# Patient Record
Sex: Female | Born: 1975
Health system: Southern US, Community
[De-identification: ages and names within clinical notes are randomized; demographics above are authoritative.]

## PROBLEM LIST (undated history)

## (undated) DIAGNOSIS — R310 Gross hematuria: Secondary | ICD-10-CM

---

## 2002-08-11 ENCOUNTER — Other Ambulatory Visit: Admission: RE | Admit: 2002-08-11 | Discharge: 2002-08-11 | Payer: Self-pay | Admitting: Obstetrics and Gynecology

## 2002-08-20 ENCOUNTER — Encounter: Admission: RE | Admit: 2002-08-20 | Discharge: 2002-08-20 | Payer: Self-pay | Admitting: Internal Medicine

## 2002-08-20 ENCOUNTER — Encounter: Payer: Self-pay | Admitting: Internal Medicine

## 2004-10-31 ENCOUNTER — Inpatient Hospital Stay (HOSPITAL_COMMUNITY): Admission: AD | Admit: 2004-10-31 | Discharge: 2004-11-02 | Payer: Self-pay | Admitting: *Deleted

## 2008-07-26 ENCOUNTER — Encounter: Admission: RE | Admit: 2008-07-26 | Discharge: 2008-07-26 | Payer: Self-pay | Admitting: Obstetrics

## 2008-09-26 ENCOUNTER — Inpatient Hospital Stay (HOSPITAL_COMMUNITY): Admission: AD | Admit: 2008-09-26 | Discharge: 2008-09-28 | Payer: Self-pay | Admitting: Obstetrics

## 2010-01-12 ENCOUNTER — Emergency Department (HOSPITAL_BASED_OUTPATIENT_CLINIC_OR_DEPARTMENT_OTHER): Admission: EM | Admit: 2010-01-12 | Discharge: 2010-01-12 | Payer: Self-pay | Admitting: Emergency Medicine

## 2010-01-12 ENCOUNTER — Ambulatory Visit: Payer: Self-pay | Admitting: Diagnostic Radiology

## 2010-01-19 ENCOUNTER — Ambulatory Visit (HOSPITAL_BASED_OUTPATIENT_CLINIC_OR_DEPARTMENT_OTHER): Admission: RE | Admit: 2010-01-19 | Discharge: 2010-01-19 | Payer: Self-pay | Admitting: Orthopedic Surgery

## 2010-01-19 HISTORY — PX: OTHER SURGICAL HISTORY: SHX169

## 2010-05-22 LAB — POCT HEMOGLOBIN-HEMACUE: Hemoglobin: 14.5 g/dL (ref 12.0–15.0)

## 2010-06-17 LAB — CBC
HCT: 35.6 % — ABNORMAL LOW (ref 36.0–46.0)
HCT: 36.1 % (ref 36.0–46.0)
Hemoglobin: 12.1 g/dL (ref 12.0–15.0)
Hemoglobin: 12.3 g/dL (ref 12.0–15.0)
MCHC: 33.5 g/dL (ref 30.0–36.0)
MCHC: 34.5 g/dL (ref 30.0–36.0)
MCV: 91 fL (ref 78.0–100.0)
MCV: 92.4 fL (ref 78.0–100.0)
Platelets: 132 10*3/uL — ABNORMAL LOW (ref 150–400)
Platelets: 133 10*3/uL — ABNORMAL LOW (ref 150–400)
RBC: 3.9 MIL/uL (ref 3.87–5.11)
RBC: 3.92 MIL/uL (ref 3.87–5.11)
RDW: 12.6 % (ref 11.5–15.5)
RDW: 12.9 % (ref 11.5–15.5)
WBC: 12.8 10*3/uL — ABNORMAL HIGH (ref 4.0–10.5)
WBC: 7.8 10*3/uL (ref 4.0–10.5)

## 2010-06-17 LAB — GLUCOSE, CAPILLARY
Glucose-Capillary: 78 mg/dL (ref 70–99)
Glucose-Capillary: 82 mg/dL (ref 70–99)
Glucose-Capillary: 84 mg/dL (ref 70–99)

## 2010-06-17 LAB — RPR: RPR Ser Ql: NONREACTIVE

## 2010-07-27 NOTE — H&P (Signed)
NAME:  Margaret Baker, Margaret Baker NO.:  [id number]   MEDICAL RECORD NO.:  [id number]          PATIENT TYPE:  INP   LOCATION:  9174                          FACILITY:  [facility]   PHYSICIAN:  Gerri Spore B. Earlene Plater, M.D.  DATE OF BIRTH:  05-May-1975   DATE OF ADMISSION:  10/31/2004  DATE OF DISCHARGE:                                HISTORY & PHYSICAL   ADMISSION DIAGNOSES:  1.  A 40 week intrauterine pregnancy.  2.  Labor.   HISTORY OF PRESENT ILLNESS:  A 35 year old white female, gravida 1, para 0,  seen earlier in the office today, found to have cervix 4 cm dilated, 80%  effaced, -1 station.  At her request, membranes were stripped.  She returned  later in the day, having contractions every 2-3 minutes and found to be 5 cm  dilated, completely effaced, and 0 station.  She is therefore admitted in  labor.  In addition, her blood pressure had been mildly elevated today in  the 140s/90s range without PIH symptoms.  Plan had been for collection of a  24 hour urine prior to the onset of labor.  This, of course, was not  completed as it had only been discussed today.  Prenatal care at [facility], Dr. Earlene Plater, otherwise uncomplicated.   PAST MEDICAL HISTORY:  Recurrent urinary tract infection without any  previous history of pyelonephritis.   PAST SURGICAL HISTORY:  None.   FAMILY HISTORY:  Noncontributory.   MEDICATIONS:  Prenatal vitamins.   ALLERGIES:  None.   SOCIAL HISTORY:  No alcohol, tobacco, or other drugs.   PRENATAL LABS:  O positive.  Rubella immune.  Hepatitis B HIV, RPR, GC,  Chlamydia all negative.  Group B strep negative.  Glucola normal.   PHYSICAL EXAMINATION:  VITAL SIGNS:  Blood pressure 140/90, weight 185,  fetal heart tones in the 150s.  GENERAL:  Alert and oriented, no acute distress.  SKIN:  Warm, dry, no lesions.  HEART:  Regular rate and rhythm.  LUNGS:  Clear to auscultation.  ABDOMEN:  Gravid.  Fundal height is appropriate.  Estimated fetal weight  8  pounds 1 ounce on ultrasound today.  PELVIC:  Cervix is 5 cm dilated, completely effaced, 0 station and vertex.   ASSESSMENT:  Term intrauterine pregnancy with labor.   PLAN:  Admission for management of labor, epidural p.r.n.  Check PIH labs.      Gerri Spore B. Earlene Plater, M.D.  Electronically Signed     WBD/MEDQ  D:  10/31/2004  T:  10/31/2004  Job:  [id number]

## 2010-07-27 NOTE — H&P (Signed)
NAME:  Margaret Baker, Margaret Baker NO.:  [id number]   MEDICAL RECORD NO.:  [id number]          PATIENT TYPE:  INP   LOCATION:  9174                          FACILITY:  [facility]   PHYSICIAN:  Richardean Sale, M.D.   DATE OF BIRTH:  19-Dec-1975   DATE OF ADMISSION:  10/31/2004  DATE OF DISCHARGE:                                HISTORY & PHYSICAL   ADMITTING DIAGNOSIS:  A 40 plus week intrauterine pregnancy, in labor.   HISTORY OF PRESENT ILLNESS:  This is a 35 year old, gravida 1, para 0, white  female with a due date of October 30, 2004 who presented to the office on  October 31, 2004 complaining of contractions.  The patient denies any vaginal  bleeding or leaking of fluid.  Reports good movement.  She is found to be 5  cm dilated in the office.  Blood pressure was mildly elevated in the office  at 140/90.  The patient denies any headaches, visual changes, or epigastric  pain.  Prenatal care has been at [facility] with Dr. Marina Gravel as  the primary attending, and pregnancy has been uncomplicated.   PAST MEDICAL HISTORY:  No prior hospitalizations or surgeries.   GYNECOLOGIC HISTORY:  No history of abnormal Pap smears or sexually  transmitted infections.   FAMILY HISTORY:  Positive for hypertension.  Heart failure in grandparents.  Breast cancer in grandmother.  Mother did have a mini-stroke.  No known  birth defects, congenital anomalies, or babies born with spina bifida, Down  syndrome, or cystic fibrosis.   SOCIAL HISTORY:  She denies tobacco, alcohol, or drugs.   MEDICATIONS:  Prenatal vitamins.   ALLERGIES:  No known drug allergies.   PHYSICAL EXAMINATION:  VITAL SIGNS:  Blood pressure is 140/95, heart rate in  the 90s and regular.  She is afebrile.  Fetal heart rate tracing shows 130-  140.  No decelerations noted.  Tocometer tracings - contractions irregular.  GENERAL:  She is a well-developed, well-nourished, white female who is in no  acute distress.  HEART:   Regular rate and rhythm.  LUNGS:  Clear to auscultation bilaterally.  ABDOMEN:  Gravid, soft, nontender.  No right upper quadrant pain elicited.  EXTREMITIES:  Trace edema in the feet bilaterally.  Deep tendon reflexes are  1 to 2+ bilaterally with no clonus.  There is no periorbital edema noted.  NEUROLOGIC:  Nonfocal.  PELVIC:  Cervix is 5 cm, 100%, -1 station, vertex.  Bag of water is intact.   PRENATAL LABORATORIES:  Blood type O positive.  Antibody screen negative.  RPR nonreactive.  Rubella immune.  Hepatitis B surface antigen non-reactive.  HIV non-reactive.  One-hour Glucola within normal limits.  Triple screen  within normal limits.  Pap test benign reactive changes.  Gonorrhea and  Chlamydia negative.  Group B beta strep negative.   ASSESSMENT:  Forty plus week intrauterine pregnancy in labor.   PLAN:  1.  Will admit to labor and delivery.  2.  Mild increase in blood pressure.  Monitor blood pressures closely.  No      signs of preeclampsia at present.  Will  check preeclampsia labs on      admission.  3.  Continuous fetal monitoring.  4.  The patient may have epidural if desired.  5.  Will augment with Pitocin or amniotomy if needed.  6.  Anticipate attempts at vaginal delivery.      Richardean Sale, M.D.  Electronically Signed     JW/MEDQ  D:  10/31/2004  T:  10/31/2004  Job:  [id number]

## 2011-03-13 ENCOUNTER — Other Ambulatory Visit (HOSPITAL_COMMUNITY)
Admission: RE | Admit: 2011-03-13 | Discharge: 2011-03-13 | Disposition: A | Discharge status: Home / Self Care | Payer: PRIVATE HEALTH INSURANCE | Source: Ambulatory Visit | Attending: Internal Medicine | Admitting: Internal Medicine

## 2011-03-13 ENCOUNTER — Encounter: Payer: Self-pay | Admitting: Internal Medicine

## 2011-03-13 ENCOUNTER — Ambulatory Visit (INDEPENDENT_AMBULATORY_CARE_PROVIDER_SITE_OTHER): Payer: PRIVATE HEALTH INSURANCE | Admitting: Internal Medicine

## 2011-03-13 DIAGNOSIS — R310 Gross hematuria: Secondary | ICD-10-CM

## 2011-03-13 DIAGNOSIS — R319 Hematuria, unspecified: Secondary | ICD-10-CM | POA: Insufficient documentation

## 2011-03-13 LAB — POCT URINALYSIS DIPSTICK
Bilirubin, UA: NEGATIVE
Glucose, UA: NEGATIVE
Ketones, UA: NEGATIVE
Nitrite, UA: NEGATIVE
Spec Grav, UA: >=1.03
Urobilinogen, UA: NEGATIVE
pH, UA: 6

## 2011-03-13 LAB — POCT RAPID STREP A (OFFICE): Rapid Strep A Screen: NEGATIVE

## 2011-03-14 LAB — COMPREHENSIVE METABOLIC PANEL
ALT: 21 U/L (ref 0–35)
AST: 20 U/L (ref 0–37)
Albumin: 4.7 g/dL (ref 3.5–5.2)
Alkaline Phosphatase: 59 U/L (ref 39–117)
BUN: 13 mg/dL (ref 6–23)
CO2: 25 mEq/L (ref 19–32)
Calcium: 9.3 mg/dL (ref 8.4–10.5)
Chloride: 103 mEq/L (ref 96–112)
Creat: 0.62 mg/dL (ref 0.50–1.10)
Glucose, Bld: 89 mg/dL (ref 70–99)
Potassium: 4.3 mEq/L (ref 3.5–5.3)
Sodium: 138 mEq/L (ref 135–145)
Total Bilirubin: 0.9 mg/dL (ref 0.3–1.2)
Total Protein: 6.4 g/dL (ref 6.0–8.3)

## 2011-03-14 LAB — CBC WITH DIFFERENTIAL/PLATELET
Basophils Absolute: 0 10*3/uL (ref 0.0–0.1)
Basophils Relative: 1 % (ref 0–1)
Eosinophils Absolute: 0.2 10*3/uL (ref 0.0–0.7)
Eosinophils Relative: 3 % (ref 0–5)
HCT: 41.1 % (ref 36.0–46.0)
Hemoglobin: 13.8 g/dL (ref 12.0–15.0)
Lymphocytes Relative: 40 % (ref 12–46)
Lymphs Abs: 2.4 10*3/uL (ref 0.7–4.0)
MCH: 29.6 pg (ref 26.0–34.0)
MCHC: 33.6 g/dL (ref 30.0–36.0)
MCV: 88.2 fL (ref 78.0–100.0)
Monocytes Absolute: 0.4 10*3/uL (ref 0.1–1.0)
Monocytes Relative: 7 % (ref 3–12)
Neutro Abs: 3.1 10*3/uL (ref 1.7–7.7)
Neutrophils Relative %: 50 % (ref 43–77)
Platelets: 235 10*3/uL (ref 150–400)
RBC: 4.66 MIL/uL (ref 3.87–5.11)
RDW: 12.5 % (ref 11.5–15.5)
WBC: 6.2 10*3/uL (ref 4.0–10.5)

## 2011-03-14 LAB — URINALYSIS
Glucose, UA: NEGATIVE mg/dL
Nitrite: NEGATIVE
Protein, ur: 100 mg/dL — AB
Specific Gravity, Urine: 1.023 (ref 1.005–1.030)
Urobilinogen, UA: 0.2 mg/dL (ref 0.0–1.0)
pH: 5.5 (ref 5.0–8.0)

## 2011-03-14 LAB — APTT: aPTT: 33 seconds (ref 24–37)

## 2011-03-14 LAB — ANTISTREPTOLYSIN O TITER: ASO: 152 IU/mL (ref 0–408)

## 2011-03-15 ENCOUNTER — Other Ambulatory Visit: Payer: Self-pay | Admitting: Internal Medicine

## 2011-03-15 ENCOUNTER — Ambulatory Visit
Admission: RE | Admit: 2011-03-15 | Discharge: 2011-03-15 | Disposition: A | Discharge status: Home / Self Care | Payer: PRIVATE HEALTH INSURANCE | Source: Ambulatory Visit | Attending: Internal Medicine | Admitting: Internal Medicine

## 2011-03-15 MED ORDER — IOHEXOL 300 MG/ML  SOLN
100.0000 mL | Freq: Once | INTRAMUSCULAR | Status: AC | PRN
  Administered 2011-03-15: 100 mL via INTRAVENOUS

## 2011-03-17 LAB — URINE CULTURE

## 2011-03-18 ENCOUNTER — Telehealth: Payer: Self-pay | Admitting: Internal Medicine

## 2011-03-18 NOTE — Telephone Encounter (Signed)
Prefer not to do either until urologist sees. I am calling them now.

## 2011-03-18 NOTE — Progress Notes (Signed)
  Subjective:    Patient ID: Margaret Baker, female    DOB: [birth year], 36 y.o.   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 She went to an urgent care and was treated with Cipro. This did not seem to help at all. Continues to have gross hematuria. Denies recent sore throat or viral infection. No dysuria. No fever or shaking chills. No nausea or vomiting. No history of gross hematuria in the past. General health has been good. Takes no chronic medications. Nonsmoker.    Review of Systems     Objective:   Physical Exam HEENT exam pharynx is clear TMs are clear; neck supple; no adenopathy chest clear; abdomen is benign; no CVA tenderness.        Assessment & Plan:  Gross hematuria without dysuria no response to Cipro   plan: Obtain lab work including CBC with differential, PT PTT, ASO titer C. met, urine culture, urine cytology. If these results are negative, dedicated CT of the kidney to rule out tumor. If CT of the kidney is negative, refer to urologist.

## 2011-03-18 NOTE — Patient Instructions (Signed)
Please await results of lab work. We will contact you with further instructions.

## 2011-03-18 NOTE — Telephone Encounter (Signed)
Spoke with patient this afternoon. Still has gross hematuria. Some mild back pain and headache. May take Tylenol. Avoid Advil. Urine culture 20,000 colonies per milliliter enterococcus. Have not retreated her. She is to see Dr. Vernie Ammons January 8 at 3 PM.

## 2011-03-27 ENCOUNTER — Other Ambulatory Visit: Payer: Self-pay | Admitting: Urology

## 2011-04-02 ENCOUNTER — Encounter (HOSPITAL_BASED_OUTPATIENT_CLINIC_OR_DEPARTMENT_OTHER): Payer: Self-pay | Admitting: *Deleted

## 2011-04-02 NOTE — Progress Notes (Signed)
NPO AFTER MN. NEEDS HH AND URINE PREG.

## 2011-04-07 NOTE — H&P (Signed)
History of Present Illness        Gross hematuria: This occurred in 6/04 and she was seen by Dr. Earlene Plater. A CT scan at that time was completely negative and specifically there are no renal lesions or calculi noted. Cystoscopically no abnormality was noted within the bladder. He recommended cytologies however it appears the patient did not undergo those.  Interval history: She reported that she had been having gross hematuria for 2 weeks when I saw her last. It occured each day and ws noted more in the morning. She describes the urine as brown/tea colored but had not seen any clots. This is not associated with any disuria, frequency or urgency. She has never had a kidney stone and noted no flank pain. She says that she has had some slight low back pain but no localizing flank pain. When this occurred in 2004 she said it only lasted for about 2 days.    I reviewed her CT scan images and noted that she in fact had a CT scan of the abdomen and pelvis with and without contrast which revealed no abnormality of the kidneys. I could not discern any stones or lesions within the collecting system of either kidney to suggest the source of her hematuria. She has had an extensive evaluation already with a negative CBC, CMP, PT, PTT, ASO titer, urine cytology and CT of the kidneys  Today she reports she continues to have gross hematuria. It is mild. It is still not associated with any flank pain or voiding symptoms.   Past Medical History Problems  1. History of  No Medical Problems  Surgical History Problems  1. History of  Leg Repair Right  Current Meds 1. No Reported Medications  Allergies Medication  1. No Known Drug Allergies  Family History Problems  1. Family history of  Breast Cancer V16.3 2. Family history of  Family Health Status - Mother's Age 53. Family history of  Family Health Status Children ___ Living Daughters 4. Family history of  Family Health Status Children ___ Living Sons 5. Family  history of  Prostate Cancer V16.42 6. Family history of  Stroke Syndrome V17.1  Social History Problems  1. Alcohol Use occassionally 2. Caffeine Use 0-1 daily 3. Marital History - Currently Married 4. Never A Smoker 5. Occupation: Research scientist (medical) Review of Systems Genitourinary, constitutional, skin, eye, otolaryngeal, hematologic/lymphatic, cardiovascular, pulmonary, endocrine, musculoskeletal, gastrointestinal, neurological and psychiatric system(s) were reviewed and pertinent findings if present are noted.  Genitourinary: hematuria.  Constitutional: feeling tired (fatigue).  ENT: sinus problems.  Musculoskeletal: back pain.  Neurological: headache.    Vitals Vital Signs BMI Calculated: 25.68 BSA Calculated: 1.79 Height: 5 ft 5.5 in Weight: 156 lb  Blood Pressure: 146 / 99 Heart Rate: 87  Physical Exam Constitutional: Well nourished and well developed . No acute distress.  ENT:. The ears and nose are normal in appearance.  Neck: The appearance of the neck is normal and no neck mass is present.  Pulmonary: No respiratory distress and normal respiratory rhythm and effort.  Cardiovascular: Heart rate and rhythm are normal . No peripheral edema.  Abdomen: The abdomen is soft and nontender. No masses are palpated. No CVA tenderness. No hernias are palpable. No hepatosplenomegaly noted.  Lymphatics: The femoral and inguinal nodes are not enlarged or tender.  Skin: Normal skin turgor, no visible rash and no visible skin lesions.  Neuro/Psych:. Mood and affect are appropriate.   Assessment  I found absolutely no abnormality of the bladder  cystoscopically. Her left ureteral orifice appeared a little bit different than the right ureteral orifice but I watched both of them he flux and was unable to determine any cloudy-appearing urine coming from either UO. I was pretty sure that I could see clear urine coming from her right UO. My suspicion lays on the left-hand side. I therefore have  discussed the fact that I think the bleeding is likely from a benign source but this needs to be completely determined and then also treated. I've gone over the procedure with her in detail including its risks and complications as well as the possible need for stent placement after the procedure. We also discussed, as one of the greatest risks, my not being able to find the source of the bleeding at the time of the procedure. Understanding this she has elected to proceed with further evaluation.   Plan Gross Hematuria (599.71)  1. Cysto  Done: 16Jan2013 2. Follow-up Schedule Surgery Office  Follow-up  Requested for: 16Jan2013 Health Maintenance (V70.0)  3. UA With REFLEX  Done: 16Jan2013 10:53AM   She will be scheduled for cystoscopy, bilateral retrograde pyelograms, ureteroscopy, possible biopsy, possible fulguration and possible stent placement under anesthesia as an outpatient.

## 2011-04-08 ENCOUNTER — Encounter (HOSPITAL_BASED_OUTPATIENT_CLINIC_OR_DEPARTMENT_OTHER)
Admission: RE | Disposition: A | Discharge status: Home / Self Care | Payer: Self-pay | Source: Ambulatory Visit | Attending: Urology

## 2011-04-08 ENCOUNTER — Encounter (HOSPITAL_BASED_OUTPATIENT_CLINIC_OR_DEPARTMENT_OTHER): Payer: Self-pay | Admitting: Anesthesiology

## 2011-04-08 ENCOUNTER — Encounter (HOSPITAL_BASED_OUTPATIENT_CLINIC_OR_DEPARTMENT_OTHER): Payer: Self-pay | Admitting: *Deleted

## 2011-04-08 ENCOUNTER — Ambulatory Visit (HOSPITAL_BASED_OUTPATIENT_CLINIC_OR_DEPARTMENT_OTHER): Payer: PRIVATE HEALTH INSURANCE | Admitting: Anesthesiology

## 2011-04-08 ENCOUNTER — Ambulatory Visit (HOSPITAL_BASED_OUTPATIENT_CLINIC_OR_DEPARTMENT_OTHER)
Admission: RE | Admit: 2011-04-08 | Discharge: 2011-04-08 | Disposition: A | Discharge status: Home / Self Care | Payer: PRIVATE HEALTH INSURANCE | Source: Ambulatory Visit | Attending: Urology | Admitting: Urology

## 2011-04-08 DIAGNOSIS — R310 Gross hematuria: Secondary | ICD-10-CM

## 2011-04-08 DIAGNOSIS — M545 Low back pain, unspecified: Secondary | ICD-10-CM | POA: Insufficient documentation

## 2011-04-08 HISTORY — DX: Gross hematuria: R31.0

## 2011-04-08 HISTORY — PX: CYSTOSCOPY W/ RETROGRADES: SHX1426

## 2011-04-08 LAB — POCT I-STAT 4, (NA,K, GLUC, HGB,HCT)
Glucose, Bld: 108 mg/dL — ABNORMAL HIGH (ref 70–99)
HCT: 39 % (ref 36.0–46.0)
Hemoglobin: 13.3 g/dL (ref 12.0–15.0)
Potassium: 4.1 mEq/L (ref 3.5–5.1)
Sodium: 143 mEq/L (ref 135–145)

## 2011-04-08 LAB — POCT PREGNANCY, URINE: Preg Test, Ur: NEGATIVE

## 2011-04-08 SURGERY — CYSTOSCOPY, WITH RETROGRADE PYELOGRAM
Anesthesia: General | Site: Ureter | Laterality: Bilateral | Wound class: Clean Contaminated

## 2011-04-08 MED ORDER — PROPOFOL 10 MG/ML IV EMUL
INTRAVENOUS | Status: DC | PRN
  Administered 2011-04-08: 250 mg via INTRAVENOUS

## 2011-04-08 MED ORDER — ONDANSETRON HCL 4 MG/2ML IJ SOLN
INTRAMUSCULAR | Status: DC | PRN
  Administered 2011-04-08: 4 mg via INTRAVENOUS

## 2011-04-08 MED ORDER — HYDROCODONE-ACETAMINOPHEN 10-300 MG PO TABS: 1.0000 | ORAL_TABLET | Freq: Four times a day (QID) | ORAL | Status: DC | PRN

## 2011-04-08 MED ORDER — FENTANYL CITRATE 0.05 MG/ML IJ SOLN
25.0000 ug | INTRAMUSCULAR | Status: DC | PRN
  Administered 2011-04-08: 50 ug via INTRAVENOUS

## 2011-04-08 MED ORDER — CIPROFLOXACIN IN D5W 200 MG/100ML IV SOLN
200.0000 mg | INTRAVENOUS | Status: AC
  Administered 2011-04-08: 200 mg via INTRAVENOUS

## 2011-04-08 MED ORDER — BELLADONNA ALKALOIDS-OPIUM 16.2-60 MG RE SUPP
RECTAL | Status: DC | PRN
  Administered 2011-04-08: 1 via RECTAL

## 2011-04-08 MED ORDER — PHENAZOPYRIDINE HCL 200 MG PO TABS
200.0000 mg | ORAL_TABLET | Freq: Once | ORAL | Status: AC
  Administered 2011-04-08: 200 mg via ORAL

## 2011-04-08 MED ORDER — HYDROCODONE-ACETAMINOPHEN 5-325 MG PO TABS
1.0000 | ORAL_TABLET | Freq: Four times a day (QID) | ORAL | Status: DC | PRN
  Administered 2011-04-08: 1 via ORAL

## 2011-04-08 MED ORDER — IOHEXOL 350 MG/ML SOLN
INTRAVENOUS | Status: DC | PRN
  Administered 2011-04-08: 50 mL via INTRAVENOUS

## 2011-04-08 MED ORDER — LACTATED RINGERS IV SOLN
INTRAVENOUS | Status: DC
  Administered 2011-04-08: 10:00:00 via INTRAVENOUS

## 2011-04-08 MED ORDER — PHENAZOPYRIDINE HCL 200 MG PO TABS: 200.0000 mg | ORAL_TABLET | Freq: Three times a day (TID) | ORAL | Status: AC | PRN

## 2011-04-08 MED ORDER — LACTATED RINGERS IV SOLN
INTRAVENOUS | Status: DC
  Administered 2011-04-08: 100 mL/h via INTRAVENOUS
  Administered 2011-04-08: 08:00:00 via INTRAVENOUS

## 2011-04-08 MED ORDER — LIDOCAINE HCL (CARDIAC) 20 MG/ML IV SOLN
INTRAVENOUS | Status: DC | PRN
  Administered 2011-04-08: 100 mg via INTRAVENOUS

## 2011-04-08 MED ORDER — MIDAZOLAM HCL 5 MG/5ML IJ SOLN
INTRAMUSCULAR | Status: DC | PRN
  Administered 2011-04-08: 2 mg via INTRAVENOUS

## 2011-04-08 MED ORDER — DEXAMETHASONE SODIUM PHOSPHATE 4 MG/ML IJ SOLN
INTRAMUSCULAR | Status: DC | PRN
  Administered 2011-04-08: 10 mg via INTRAVENOUS

## 2011-04-08 MED ORDER — FENTANYL CITRATE 0.05 MG/ML IJ SOLN
INTRAMUSCULAR | Status: DC | PRN
  Administered 2011-04-08: 25 ug via INTRAVENOUS
  Administered 2011-04-08: 50 ug via INTRAVENOUS
  Administered 2011-04-08: 25 ug via INTRAVENOUS

## 2011-04-08 MED ORDER — PROMETHAZINE HCL 25 MG/ML IJ SOLN: 6.2500 mg | INTRAMUSCULAR | Status: DC | PRN

## 2011-04-08 MED ORDER — OXYBUTYNIN CHLORIDE 5 MG PO TABS: 5.0000 mg | ORAL_TABLET | Freq: Once | ORAL | Status: DC

## 2011-04-08 MED ORDER — SODIUM CHLORIDE 0.9 % IR SOLN
Status: DC | PRN
  Administered 2011-04-08: 6000 mL

## 2011-04-08 SURGICAL SUPPLY — 40 items
ADAPTER CATH URET PLST 4-6FR (CATHETERS) ×2 IMPLANT
ADPR CATH URET STRL DISP 4-6FR (CATHETERS) ×2
BAG DRAIN URO-CYSTO SKYTR STRL (DRAIN) ×3 IMPLANT
BAG DRN UROCATH (DRAIN) ×2
BASKET LASER NITINOL 1.9FR (BASKET) IMPLANT
BASKET STNLS GEMINI 4WIRE 3FR (BASKET) IMPLANT
BASKET ZERO TIP NITINOL 2.4FR (BASKET) IMPLANT
BRUSH URET BIOPSY 3F (UROLOGICAL SUPPLIES) IMPLANT
BSKT STON RTRVL 120 1.9FR (BASKET)
BSKT STON RTRVL GEM 120X11 3FR (BASKET)
BSKT STON RTRVL ZERO TP 2.4FR (BASKET)
CANISTER SUCT LVC 12 LTR MEDI- (MISCELLANEOUS) ×2 IMPLANT
CATH INTERMIT  6FR 70CM (CATHETERS) IMPLANT
CATH URET 5FR 28IN CONE TIP (BALLOONS)
CATH URET 5FR 70CM CONE TIP (BALLOONS) IMPLANT
CLOTH BEACON ORANGE TIMEOUT ST (SAFETY) ×3 IMPLANT
DRAPE CAMERA CLOSED 9X96 (DRAPES) ×3 IMPLANT
ELECT REM PT RETURN 9FT ADLT (ELECTROSURGICAL)
ELECTRODE REM PT RTRN 9FT ADLT (ELECTROSURGICAL) IMPLANT
GLOVE BIO SURGEON STRL SZ8 (GLOVE) ×3 IMPLANT
GLOVE INDICATOR 6.5 STRL GRN (GLOVE) ×6 IMPLANT
GOWN PREVENTION PLUS LG XLONG (DISPOSABLE) ×3 IMPLANT
GOWN STRL REIN XL XLG (GOWN DISPOSABLE) ×3 IMPLANT
GUIDEWIRE 0.038 PTFE COATED (WIRE) IMPLANT
GUIDEWIRE ANG ZIPWIRE 038X150 (WIRE) IMPLANT
GUIDEWIRE STR DUAL SENSOR (WIRE) ×3 IMPLANT
IV NS IRRIG 3000ML ARTHROMATIC (IV SOLUTION) ×6 IMPLANT
KIT BALLIN UROMAX 15FX10 (LABEL) IMPLANT
KIT BALLN UROMAX 15FX4 (MISCELLANEOUS) IMPLANT
KIT BALLN UROMAX 26 75X4 (MISCELLANEOUS)
LASER FIBER DISP (UROLOGICAL SUPPLIES) IMPLANT
PACK CYSTOSCOPY (CUSTOM PROCEDURE TRAY) ×3 IMPLANT
SET HIGH PRES BAL DIL (LABEL)
SHEATH ACCESS URETERAL 38CM (SHEATH) ×2 IMPLANT
SHEATH URET ACCESS 12FR/35CM (UROLOGICAL SUPPLIES) IMPLANT
SHEATH URET ACCESS 12FR/55CM (UROLOGICAL SUPPLIES) IMPLANT
STENT PERCUFLEX 4.8FRX24 (STENTS) ×4 IMPLANT
SYR 30ML LL (SYRINGE) ×2 IMPLANT
SYR CONTROL 10ML LL (SYRINGE) ×2 IMPLANT
WATER STERILE IRR 3000ML UROMA (IV SOLUTION) IMPLANT

## 2011-04-08 NOTE — Anesthesia Preprocedure Evaluation (Addendum)
Anesthesia Evaluation  Patient identified by MRN, date of birth, ID band Patient awake    Reviewed: Allergy & Precautions, H&P , NPO status , Patient's Chart, lab work & pertinent test results  History of Anesthesia Complications Negative for: history of anesthetic complications  Airway Mallampati: I TM Distance: >3 FB Neck ROM: full    Dental No notable dental hx. (+) Teeth Intact and Dental Advidsory Given   Pulmonary neg pulmonary ROS,  clear to auscultation  Pulmonary exam normal       Cardiovascular Exercise Tolerance: Good neg cardio ROS regular Normal    Neuro/Psych Negative Neurological ROS  Negative Psych ROS   GI/Hepatic negative GI ROS, Neg liver ROS,   Endo/Other  Negative Endocrine ROS  Renal/GU negative Renal ROS  Genitourinary negative   Musculoskeletal   Abdominal Normal abdominal exam  (+)   Peds  Hematology negative hematology ROS (+)   Anesthesia Other Findings   Reproductive/Obstetrics negative OB ROS                          Anesthesia Physical Anesthesia Plan  ASA: I  Anesthesia Plan: General   Post-op Pain Management:    Induction:   Airway Management Planned:   Additional Equipment:   Intra-op Plan:   Post-operative Plan:   Informed Consent: I have reviewed the patients History and Physical, chart, labs and discussed the procedure including the risks, benefits and alternatives for the proposed anesthesia with the patient or authorized representative who has indicated his/her understanding and acceptance.   Dental Advisory Given  Plan Discussed with: CRNA  Anesthesia Plan Comments:         Anesthesia Quick Evaluation

## 2011-04-08 NOTE — Op Note (Signed)
PATIENT:  Margaret Baker  PRE-OPERATIVE DIAGNOSIS: Gross, painless hematuria   POST-OPERATIVE DIAGNOSIS: Same  PROCEDURE:  1. Cystoscopy 2. Bilateral retrograde pyelograms with interpretation. 3. Bilateral ureteroscopy. 4. Bilateral double-J stent placement.  SURGEON: Garnett Farm, MD  INDICATION: Margaret Baker is a 36 year old female patient who has been experiencing gross, painless hematuria for nearly a month. She has been evaluated thoroughly with multiple laboratory studies all of which were found to be negative. She has also had a urine cytology that was negative and a CT scan without contrast that revealed no renal lesions or calculi. She underwent cystoscopy in my office with no intravesical lesion having been noted and possible slightly discolored reflux from the left UO. She is therefore brought to the operating room today for full valuation upper upper tracts with retrograde pyelography and probable ureteroscopy.  ANESTHESIA:  General  EBL:  Minimal  DRAINS: 4 French, 24 cm contour stents on right and left sides. (String present)  SPECIMEN:  None   DESCRIPTION OF PROCEDURE: The patient was taken to the major OR and placed on the table. General anesthesia was administered and then the patient was moved to the dorsal lithotomy position. Her genitalia was sterilely prepped and draped. An official timeout was performed.  Initially the 22 French cystoscope with 12 lens was passed under direct vision into the bladder. The bladder was then entered and fully inspected. It was noted be free of any tumors stones or inflammatory lesions. Ureteral orifices were of normal configuration and position. I observed the ureteral orifices were effluxing noted what appeared to be clear reflux from the right. On the left-hand side there appeared to be clear reflux as well however I noted whitish material that floated in the urine and was particulate in nature effluxing with the urine. A 6 French  open-ended ureteral catheter was then passed through the cystoscope into the ureteral orifice in order to perform a right retrograde pyelogram initially.  A retrograde pyelogram was performed by injecting full-strength contrast up the right ureter under direct fluoroscopic control. This revealed a normal ureter throughout its length with no mass effect or filling defects. The patient had a large renal pelvis but no evidence of UPJ obstruction. The calyces all appeared sharp. There did however appear to be an oval filling defect in the upper pole. I then proceeded to perform a left retrograde pyelogram in an identical fashion. This study revealed a normal ureter as well throughout its entire length. There also appeared to be oval filling defects but these were definitely associated with the renal papilla. Otherwise there appeared to be absolutely no abnormality.  I redirected my attention to the right ureteral orifice and passed a guidewire through the cystoscope and up the ureter and near the renal pelvis. I then used the inner portion of the ureteral access sheath to gently dilate the intramural ureter and ureter up into the renal pelvic region. I noted that passage of the access sheath inner cannula kidney with mild resistance but this was not considered pathologic in nature. After leaving the access sheath in place for 2 minutes I removed this and left the guidewire in place. The 7 [place] digital ureteroscope was then passed over the guidewire and up the right ureter under direct visualization. No abnormality of the entire ureter was noted. I then entered the renal collecting system and first drained the system through the ureteroscope using a syringe. I then visualized the area where the filling defect was seen on pyelography and  this appeared to be a renal papilla. I inspected every calyx within the right kidney and noted no abnormality other than the fact that all of the renal papilla appeared bulbar in  shape with superficial vessels noted on each of them. They all appeared identical in this configuration and no active bleeding was identified as well as no abnormal lesions within any of the calyces, infundibulum he or renal pelvis. I therefore removed the ureteroscope and redirected my attention to the left side.  I reinserted the cystoscope, passed the guidewire into the renal pelvis, dilated the left ureter in an identical fashion as the right side and then passed the ureteroscope over the guidewire again under direct visualization. This revealed a normal ureter throughout its length. Upon entering the renal pelvis I repeated my systematic inspection of each calyx and found again that each of them had a somewhat bulbar appearance distinctly different from the typical cone shape, striated renal papilla normally seen and I did find a middle pole calyx with a renal papilla that appeared entirely normal however all of the other papilla had the bulbar appearance similar to the contralateral kidney. I removed all fluid from the renal pelvis and filled it just slightly with fluid under no pressure to attempt to visualize any bleeding source and none could be identified. The renal pelvis was inspected and also noted be normal. I therefore passed a guidewire through the ureteroscope and left this in place and removed the ureteroscope.  The cystoscope was backloaded over the guidewire and a stent was passed over the guidewire into the area the renal pelvis on the left side. Good curl was noted in the renal pelvis and bladder as the guidewire was removed. This was performed on the right side after passing a guidewire up the right ureter and again good curl was noted in the renal pelvis and bladder. I therefore drained the bladder. The string affixed to the distal aspect of the stents were left on and will be used for stent removal in the office and were affixed to the patient with tape. She was awakened and taken to the  recovery room in stable and satisfactory condition. No intraoperative complications occurred.  PLAN OF CARE: Discharge to home after PACU  PATIENT DISPOSITION:  PACU - hemodynamically stable.

## 2011-04-08 NOTE — Transfer of Care (Signed)
Immediate Anesthesia Transfer of Care Note  Patient: Margaret Baker  Procedure(s) Performed:  CYSTOSCOPY WITH RETROGRADE PYELOGRAM; CYSTOSCOPY WITH STENT PLACEMENT  Patient Location: PACU  Anesthesia Type: General  Level of Consciousness: drowsy, follows commands, oriented.  Airway & Oxygen Therapy: Patient Spontanous Breathing and Patient connected to face mask oxygen  Post-op Assessment: Report given to PACU RN and Post -op Vital signs reviewed and stable  Post vital signs: Reviewed and stable  Complications: No apparent anesthesia complications

## 2011-04-08 NOTE — Interval H&P Note (Signed)
History and Physical Interval Note:  04/08/2011 6:58 AM  Margaret Baker  has presented today for surgery, with the diagnosis of GROSS HEMATURIA  The various methods of treatment have been discussed with the patient and family. After consideration of risks, benefits and other options for treatment, the patient has consented to  Procedure(s): CYSTOSCOPY WITH RETROGRADE PYELOGRAM, URETEROSCOPY AND STENT PLACEMENT as a surgical intervention .  The patients' history has been reviewed, patient examined, no change in status, stable for surgery.  I have reviewed the patients' chart and labs.  Questions were answered to the patient's satisfaction.     Garnett Farm

## 2011-04-08 NOTE — Anesthesia Procedure Notes (Signed)
Procedure Name: LMA Insertion Date/Time: 04/08/2011 7:35 AM Performed by: Huel Coventry Pre-anesthesia Checklist: Patient identified, Emergency Drugs available, Suction available and Patient being monitored Patient Re-evaluated:Patient Re-evaluated prior to inductionOxygen Delivery Method: Circle System Utilized Preoxygenation: Pre-oxygenation with 100% oxygen Intubation Type: IV induction Ventilation: Mask ventilation without difficulty LMA: LMA inserted LMA Size: 4.0 Number of attempts: 1 Airway Equipment and Method: bite block Placement Confirmation: positive ETCO2 Tube secured with: Tape Dental Injury: Teeth and Oropharynx as per pre-operative assessment

## 2011-04-08 NOTE — Anesthesia Postprocedure Evaluation (Signed)
Anesthesia Post Note  Patient: Margaret Baker  Procedure(s) Performed:  CYSTOSCOPY WITH RETROGRADE PYELOGRAM; CYSTOSCOPY WITH STENT PLACEMENT  Anesthesia type: General  Patient location: PACU  Post pain: Pain level controlled  Post assessment: Post-op Vital signs reviewed  Last Vitals:  Filed Vitals:   04/08/11 0915  BP:   Pulse: 71  Temp:   Resp: 14    Post vital signs: Reviewed  Level of consciousness: sedated  Complications: No apparent anesthesia complications

## 2011-04-09 ENCOUNTER — Encounter (HOSPITAL_BASED_OUTPATIENT_CLINIC_OR_DEPARTMENT_OTHER): Payer: Self-pay | Admitting: Urology

## 2015-11-24 DIAGNOSIS — Z23 Encounter for immunization: Secondary | ICD-10-CM | POA: Diagnosis not present

## 2015-11-24 DIAGNOSIS — Z6824 Body mass index (BMI) 24.0-24.9, adult: Secondary | ICD-10-CM | POA: Diagnosis not present

## 2015-11-24 DIAGNOSIS — Z30433 Encounter for removal and reinsertion of intrauterine contraceptive device: Secondary | ICD-10-CM | POA: Diagnosis not present

## 2015-11-24 DIAGNOSIS — Z3202 Encounter for pregnancy test, result negative: Secondary | ICD-10-CM | POA: Diagnosis not present

## 2015-11-24 DIAGNOSIS — Z1151 Encounter for screening for human papillomavirus (HPV): Secondary | ICD-10-CM | POA: Diagnosis not present

## 2015-11-24 DIAGNOSIS — Z01419 Encounter for gynecological examination (general) (routine) without abnormal findings: Secondary | ICD-10-CM | POA: Diagnosis not present

## 2015-12-19 DIAGNOSIS — Z1329 Encounter for screening for other suspected endocrine disorder: Secondary | ICD-10-CM | POA: Diagnosis not present

## 2015-12-19 DIAGNOSIS — Z30431 Encounter for routine checking of intrauterine contraceptive device: Secondary | ICD-10-CM | POA: Diagnosis not present

## 2015-12-19 DIAGNOSIS — Z13220 Encounter for screening for lipoid disorders: Secondary | ICD-10-CM | POA: Diagnosis not present

## 2015-12-19 DIAGNOSIS — Z130 Encounter for screening for diseases of the blood and blood-forming organs and certain disorders involving the immune mechanism: Secondary | ICD-10-CM | POA: Diagnosis not present

## 2015-12-19 DIAGNOSIS — Z131 Encounter for screening for diabetes mellitus: Secondary | ICD-10-CM | POA: Diagnosis not present

## 2015-12-19 DIAGNOSIS — Z0000 Encounter for general adult medical examination without abnormal findings: Secondary | ICD-10-CM | POA: Diagnosis not present

## 2016-10-24 ENCOUNTER — Ambulatory Visit (INDEPENDENT_AMBULATORY_CARE_PROVIDER_SITE_OTHER): Payer: Self-pay | Admitting: Internal Medicine

## 2016-10-24 ENCOUNTER — Encounter: Payer: Self-pay | Admitting: Internal Medicine

## 2016-10-24 VITALS — BP 136/90 | HR 83 | Temp 97.9°F | Wt 171.0 lb

## 2016-10-24 DIAGNOSIS — N390 Urinary tract infection, site not specified: Secondary | ICD-10-CM | POA: Diagnosis not present

## 2016-10-24 LAB — POCT URINALYSIS DIPSTICK
Bilirubin, UA: NEGATIVE
Blood, UA: NEGATIVE
Glucose, UA: NEGATIVE
Ketones, UA: NEGATIVE
Leukocytes, UA: NEGATIVE
Nitrite, UA: NEGATIVE
Protein, UA: NEGATIVE
Spec Grav, UA: <=1.005 — AB
Urobilinogen, UA: 0.2 U/dL
pH, UA: 6

## 2016-10-24 MED ORDER — FLUCONAZOLE 150 MG PO TABS: 150.0000 mg | ORAL_TABLET | Freq: Once | ORAL | 1 refills | Status: AC

## 2016-10-24 MED ORDER — CIPROFLOXACIN HCL 500 MG PO TABS: 500.0000 mg | ORAL_TABLET | Freq: Two times a day (BID) | ORAL | 0 refills | Status: DC

## 2016-10-24 NOTE — Progress Notes (Signed)
   Subjective:    Patient ID: Margaret Baker, female    DOB: Apr 28, 1975, 41 y.o.   MRN: [id number]  HPI  41 year old  Female  With UTI symptoms  Onset around August 7. It had intercourse for a couple of days prior to onset of symptoms. She had considerable dysuria Past a little clot of blood. No back pain. Did have some chills that evening but no fever. No vomiting.  History of gross hematuria 2013 seen by Dr. on line and had cystoscopy, retrograde pyelogram with stent placement. No bleeding was identified. Each renal pelvis had a somewhat bulbar appearance that was different from normal. Stents were removed in the office. She had no further issues.  She has been taking some Bactrim for the past several days with minimal relief. The graft urine dipstick today is negative.    Review of Systems see above     Objective:   Physical Exam No CVA tenderness         Assessment & Plan:  Dysuria? Partially treated UTI  Plan: Urine culture pending. She has tried Cipro before with success. Start Cipro 500 mg daily for 7 days. Diflucan provided for possible candida vaginitis while on antibiotics. She is to call if not better in one week.

## 2016-10-24 NOTE — Patient Instructions (Signed)
Cipro 500 mg twice daily for 7 days. Diflucan if needed for Candida vaginitis. Call if not better in 7 days. It was a pleasure to see you today.

## 2016-10-25 LAB — URINE CULTURE: Organism ID, Bacteria: NO GROWTH

## 2017-01-10 ENCOUNTER — Other Ambulatory Visit: Payer: BLUE CROSS/BLUE SHIELD | Admitting: Internal Medicine

## 2017-01-10 DIAGNOSIS — Z1321 Encounter for screening for nutritional disorder: Secondary | ICD-10-CM

## 2017-01-10 DIAGNOSIS — Z130 Encounter for screening for diseases of the blood and blood-forming organs and certain disorders involving the immune mechanism: Secondary | ICD-10-CM | POA: Diagnosis not present

## 2017-01-10 DIAGNOSIS — Z13220 Encounter for screening for lipoid disorders: Secondary | ICD-10-CM | POA: Diagnosis not present

## 2017-01-10 DIAGNOSIS — Z1329 Encounter for screening for other suspected endocrine disorder: Secondary | ICD-10-CM | POA: Diagnosis not present

## 2017-01-10 DIAGNOSIS — Z0000 Encounter for general adult medical examination without abnormal findings: Secondary | ICD-10-CM

## 2017-01-11 LAB — COMPLETE METABOLIC PANEL WITH GFR
AG Ratio: 1.8 (calc) (ref 1.0–2.5)
ALT: 21 U/L (ref 6–29)
AST: 18 U/L (ref 10–30)
Albumin: 4 g/dL (ref 3.6–5.1)
Alkaline phosphatase (APISO): 55 U/L (ref 33–115)
BUN: 14 mg/dL (ref 7–25)
CO2: 27 mmol/L (ref 20–32)
Calcium: 9.1 mg/dL (ref 8.6–10.2)
Chloride: 103 mmol/L (ref 98–110)
Creat: 0.64 mg/dL (ref 0.50–1.10)
GFR, Est African American: 129 mL/min/{1.73_m2} (ref >=60)
GFR, Est Non African American: 112 mL/min/{1.73_m2} (ref >=60)
Globulin: 2.2 g/dL (calc) (ref 1.9–3.7)
Glucose, Bld: 97 mg/dL (ref 65–99)
Potassium: 4.5 mmol/L (ref 3.5–5.3)
Sodium: 138 mmol/L (ref 135–146)
Total Bilirubin: 0.6 mg/dL (ref 0.2–1.2)
Total Protein: 6.2 g/dL (ref 6.1–8.1)

## 2017-01-11 LAB — LIPID PANEL
Cholesterol: 171 mg/dL (ref <200)
HDL: 59 mg/dL (ref >50)
LDL Cholesterol (Calc): 97 mg/dL (calc)
Non-HDL Cholesterol (Calc): 112 mg/dL (calc) (ref <130)
Total CHOL/HDL Ratio: 2.9 (calc) (ref <5.0)
Triglycerides: 63 mg/dL (ref <150)

## 2017-01-11 LAB — CBC WITH DIFFERENTIAL/PLATELET
Basophils Absolute: 42 cells/uL (ref 0–200)
Basophils Relative: 0.6 %
Eosinophils Absolute: 126 cells/uL (ref 15–500)
Eosinophils Relative: 1.8 %
HCT: 39.4 % (ref 35.0–45.0)
Hemoglobin: 13.5 g/dL (ref 11.7–15.5)
Lymphs Abs: 1953 cells/uL (ref 850–3900)
MCH: 29.8 pg (ref 27.0–33.0)
MCHC: 34.3 g/dL (ref 32.0–36.0)
MCV: 87 fL (ref 80.0–100.0)
MPV: 10.1 fL (ref 7.5–12.5)
Monocytes Relative: 6 %
Neutro Abs: 4459 cells/uL (ref 1500–7800)
Neutrophils Relative %: 63.7 %
Platelets: 221 10*3/uL (ref 140–400)
RBC: 4.53 10*6/uL (ref 3.80–5.10)
RDW: 11.7 % (ref 11.0–15.0)
Total Lymphocyte: 27.9 %
WBC mixed population: 420 cells/uL (ref 200–950)
WBC: 7 10*3/uL (ref 3.8–10.8)

## 2017-01-11 LAB — VITAMIN D 25 HYDROXY (VIT D DEFICIENCY, FRACTURES): Vit D, 25-Hydroxy: 36 ng/mL (ref 30–100)

## 2017-01-11 LAB — TSH: TSH: 1.5 mIU/L

## 2017-01-14 ENCOUNTER — Ambulatory Visit (INDEPENDENT_AMBULATORY_CARE_PROVIDER_SITE_OTHER): Payer: BLUE CROSS/BLUE SHIELD | Admitting: Internal Medicine

## 2017-01-14 ENCOUNTER — Encounter: Payer: Self-pay | Admitting: Internal Medicine

## 2017-01-14 VITALS — BP 110/70 | HR 85 | Temp 98.7°F | Ht 65.5 in | Wt 176.0 lb

## 2017-01-14 DIAGNOSIS — B009 Herpesviral infection, unspecified: Secondary | ICD-10-CM

## 2017-01-14 DIAGNOSIS — Z0000 Encounter for general adult medical examination without abnormal findings: Secondary | ICD-10-CM

## 2017-01-14 LAB — POCT URINALYSIS DIPSTICK
Bilirubin, UA: NEGATIVE
Blood, UA: NEGATIVE
Glucose, UA: NEGATIVE
Ketones, UA: NEGATIVE
Leukocytes, UA: NEGATIVE
Nitrite, UA: NEGATIVE
Protein, UA: NEGATIVE
Spec Grav, UA: 1.015 (ref 1.010–1.025)
Urobilinogen, UA: 0.2 E.U./dL
pH, UA: 6.5 (ref 5.0–8.0)

## 2017-01-14 MED ORDER — VALACYCLOVIR HCL 500 MG PO TABS: 500.0000 mg | ORAL_TABLET | Freq: Every day | ORAL | 1 refills | Status: AC

## 2017-01-14 NOTE — Progress Notes (Signed)
   Subjective:    Patient ID: Margaret Baker, female    DOB: 01/17/1976, 41 y.o.   MRN: [id number]  HPI 41 year old Female for health maintenance exam and evaluation of medical issues.  Her general health is excellent.  She does have lesions consistent with herpes simplex type I today.  We will place her on Valtrex.  History of allergic rhinitis  History of gross hematuria 2013.  Outpatient workup was negative including culture and ASO titer.  She had office visit with cystoscopy with no evidence of source of bleeding.  Subsequently underwent upper tract evaluation under anesthesia.  There has been a filling defect on pyelography.  On inspection it appeared that the renal papillae on the right had a bulbar shape with superficial vessels on each one of the them.  There was a similar appearance on the left.  It was assumed that the source of bleeding was possibly on the left and a stent was placed.  She has had no further recurrence of bleeding.  In August 2018 she was treated for urinary tract infection symptoms blood culture revealed no growth.  Social history: She has never smoked.  Social alcohol consumption.  Married.  Family history: Mother with history of stroke and memory loss.  Father deceased with history of prostate cancer.  One brother alive in good health.      Review of Systems  Constitutional: Negative.   Respiratory: Negative.   Cardiovascular: Negative.   Gastrointestinal: Negative.   Genitourinary: Negative.   Musculoskeletal: Negative.   Neurological: Negative.   Psychiatric/Behavioral: Negative.        Objective:   Physical Exam  Constitutional: She is oriented to person, place, and time. She appears well-developed and well-nourished. No distress.  HENT:  Head: Normocephalic and atraumatic.  Right Ear: External ear normal.  Left Ear: External ear normal.  Mouth/Throat: Oropharynx is clear and moist.  Eyes: Conjunctivae and EOM are normal. Pupils are  equal, round, and reactive to light. Left eye exhibits no discharge. No scleral icterus.  Neck: Neck supple. No JVD present. No thyromegaly present.  Cardiovascular: Normal rate, regular rhythm, normal heart sounds and intact distal pulses.  No murmur heard. Pulmonary/Chest: Effort normal and breath sounds normal. No respiratory distress. She has no wheezes. She has no rales.  Breasts normal female  Abdominal: Soft. Bowel sounds are normal. She exhibits no distension and no mass. There is no tenderness. There is no rebound and no guarding.  Genitourinary:  Genitourinary Comments: Deferred to GYN  Lymphadenopathy:    She has no cervical adenopathy.  Neurological: She is alert and oriented to person, place, and time. She has normal reflexes. No cranial nerve deficit. Coordination normal.  Skin: Skin is warm and dry. No rash noted. She is not diaphoretic.  Psychiatric: She has a normal mood and affect. Her behavior is normal. Judgment and thought content normal.  Vitals reviewed.         Assessment & Plan:  Normal health maintenance exam  HSV type I-treat with Valtrex  Lab work reviewed and completely within normal limits including TSH, vitamin D, lipid panel C met and CBC.  Plan is to return in 1-2 years or as needed. 

## 2017-02-03 ENCOUNTER — Encounter: Payer: Self-pay | Admitting: Internal Medicine

## 2017-02-03 DIAGNOSIS — B009 Herpesviral infection, unspecified: Secondary | ICD-10-CM | POA: Insufficient documentation

## 2017-02-03 NOTE — Patient Instructions (Signed)
It was a pleasure to see you today.  Take Valtrex as directed for Herpes simplex type I lesion.  Return in 1-2 years or as needed.

## 2017-04-03 DIAGNOSIS — B001 Herpesviral vesicular dermatitis: Secondary | ICD-10-CM | POA: Diagnosis not present

## 2017-04-21 DIAGNOSIS — Z1231 Encounter for screening mammogram for malignant neoplasm of breast: Secondary | ICD-10-CM | POA: Diagnosis not present

## 2017-04-21 DIAGNOSIS — Z6828 Body mass index (BMI) 28.0-28.9, adult: Secondary | ICD-10-CM | POA: Diagnosis not present

## 2017-04-21 DIAGNOSIS — Z01419 Encounter for gynecological examination (general) (routine) without abnormal findings: Secondary | ICD-10-CM | POA: Diagnosis not present

## 2017-06-22 DIAGNOSIS — R000 Tachycardia, unspecified: Secondary | ICD-10-CM | POA: Diagnosis not present

## 2017-09-04 DIAGNOSIS — H66002 Acute suppurative otitis media without spontaneous rupture of ear drum, left ear: Secondary | ICD-10-CM | POA: Diagnosis not present

## 2017-09-18 ENCOUNTER — Ambulatory Visit: Payer: BLUE CROSS/BLUE SHIELD | Admitting: Internal Medicine

## 2017-09-18 ENCOUNTER — Encounter: Payer: Self-pay | Admitting: Internal Medicine

## 2017-09-18 VITALS — Ht 65.5 in

## 2017-09-18 DIAGNOSIS — B0089 Other herpesviral infection: Secondary | ICD-10-CM

## 2017-09-18 DIAGNOSIS — H6590 Unspecified nonsuppurative otitis media, unspecified ear: Secondary | ICD-10-CM

## 2017-09-18 MED ORDER — METHYLPREDNISOLONE ACETATE 80 MG/ML IJ SUSP
80.0000 mg | Freq: Once | INTRAMUSCULAR | Status: AC
Start: 2017-09-18 — End: 2017-09-18
  Administered 2017-09-18: 80 mg via INTRAMUSCULAR

## 2017-09-18 MED ORDER — AZITHROMYCIN 250 MG PO TABS: ORAL_TABLET | ORAL | 0 refills | Status: DC

## 2017-09-18 MED ORDER — VALACYCLOVIR HCL 500 MG PO TABS: 500.0000 mg | ORAL_TABLET | Freq: Two times a day (BID) | ORAL | 11 refills | Status: DC

## 2017-09-18 NOTE — Progress Notes (Signed)
   Subjective:    Patient ID: Margaret Baker, female    DOB: 01-07-1976, 42 y.o.   MRN: [id number]  HPI [place] the beach recently [place] and developed respiratory infection symptoms.  Has ear pain and maxillary sinus congestion.  History of HSV type I exacerbated by sunlight recently.    Review of Systems see above     Objective:   Physical Exam  Both TMs are slightly full but not really red.  Pharynx is clear.  Neck is supple.  Chest clear.  No evidence of outbreak of herpes simplex type I at this time.      Assessment & Plan:  Bilateral serous otitis media  History of HSV type I  Plan: Depo-Medrol 80 mg IM.  Zithromax Z-Pak take 2 tablets p.o. day 1 followed by 1 p.o. days 2 through 5.  Valtrex 500 mg twice daily for 5 days as needed for outbreaks of Herpes simplex type I.

## 2017-09-18 NOTE — Patient Instructions (Addendum)
Depo-Medrol 80 mg a.m.  Zithromax Z-PAK take 2 p.o. day 1 followed by 1 p.o. days 2 through 5.  Refill Valtrex 500 mg twice daily for 5 days with as needed 1 year refills for Herpes simplex type I

## 2018-01-12 ENCOUNTER — Other Ambulatory Visit: Payer: Self-pay | Admitting: Internal Medicine

## 2018-01-12 DIAGNOSIS — Z1329 Encounter for screening for other suspected endocrine disorder: Secondary | ICD-10-CM

## 2018-01-12 DIAGNOSIS — Z1321 Encounter for screening for nutritional disorder: Secondary | ICD-10-CM

## 2018-01-12 DIAGNOSIS — Z0000 Encounter for general adult medical examination without abnormal findings: Secondary | ICD-10-CM

## 2018-01-12 DIAGNOSIS — Z13220 Encounter for screening for lipoid disorders: Secondary | ICD-10-CM

## 2018-01-12 DIAGNOSIS — B009 Herpesviral infection, unspecified: Secondary | ICD-10-CM

## 2018-01-13 ENCOUNTER — Other Ambulatory Visit: Payer: Self-pay | Admitting: Internal Medicine

## 2018-01-13 ENCOUNTER — Other Ambulatory Visit: Payer: BLUE CROSS/BLUE SHIELD | Admitting: Internal Medicine

## 2018-01-15 ENCOUNTER — Other Ambulatory Visit: Payer: Self-pay | Admitting: Internal Medicine

## 2018-01-15 DIAGNOSIS — Z1329 Encounter for screening for other suspected endocrine disorder: Secondary | ICD-10-CM | POA: Diagnosis not present

## 2018-01-15 DIAGNOSIS — Z1321 Encounter for screening for nutritional disorder: Secondary | ICD-10-CM | POA: Diagnosis not present

## 2018-01-15 DIAGNOSIS — Z0000 Encounter for general adult medical examination without abnormal findings: Secondary | ICD-10-CM | POA: Diagnosis not present

## 2018-01-15 DIAGNOSIS — B009 Herpesviral infection, unspecified: Secondary | ICD-10-CM

## 2018-01-15 DIAGNOSIS — Z13220 Encounter for screening for lipoid disorders: Secondary | ICD-10-CM

## 2018-01-16 ENCOUNTER — Ambulatory Visit (INDEPENDENT_AMBULATORY_CARE_PROVIDER_SITE_OTHER): Payer: BLUE CROSS/BLUE SHIELD | Admitting: Internal Medicine

## 2018-01-16 ENCOUNTER — Encounter: Payer: Self-pay | Admitting: Internal Medicine

## 2018-01-16 VITALS — BP 110/70 | HR 77 | Ht 65.5 in | Wt 180.0 lb

## 2018-01-16 DIAGNOSIS — Z23 Encounter for immunization: Secondary | ICD-10-CM

## 2018-01-16 DIAGNOSIS — R739 Hyperglycemia, unspecified: Secondary | ICD-10-CM | POA: Diagnosis not present

## 2018-01-16 DIAGNOSIS — E7800 Pure hypercholesterolemia, unspecified: Secondary | ICD-10-CM

## 2018-01-16 DIAGNOSIS — Z0000 Encounter for general adult medical examination without abnormal findings: Secondary | ICD-10-CM | POA: Diagnosis not present

## 2018-01-16 DIAGNOSIS — R748 Abnormal levels of other serum enzymes: Secondary | ICD-10-CM | POA: Diagnosis not present

## 2018-01-16 DIAGNOSIS — Z6829 Body mass index (BMI) 29.0-29.9, adult: Secondary | ICD-10-CM

## 2018-01-16 LAB — POCT URINALYSIS DIPSTICK
Appearance: NEGATIVE
Bilirubin, UA: NEGATIVE
Blood, UA: NEGATIVE
Glucose, UA: NEGATIVE
Ketones, UA: NEGATIVE
Leukocytes, UA: NEGATIVE
Nitrite, UA: NEGATIVE
Odor: NEGATIVE
Protein, UA: NEGATIVE
Spec Grav, UA: 1.015 (ref 1.010–1.025)
Urobilinogen, UA: 0.2 E.U./dL
pH, UA: 6.5 (ref 5.0–8.0)

## 2018-01-16 LAB — COMPLETE METABOLIC PANEL WITH GFR
AG Ratio: 2 (calc) (ref 1.0–2.5)
ALT: 39 U/L — ABNORMAL HIGH (ref 6–29)
AST: 41 U/L — ABNORMAL HIGH (ref 10–30)
Albumin: 4.2 g/dL (ref 3.6–5.1)
Alkaline phosphatase (APISO): 55 U/L (ref 33–115)
BUN: 16 mg/dL (ref 7–25)
CO2: 26 mmol/L (ref 20–32)
Calcium: 9.4 mg/dL (ref 8.6–10.2)
Chloride: 105 mmol/L (ref 98–110)
Creat: 0.64 mg/dL (ref 0.50–1.10)
GFR, Est African American: 128 mL/min/{1.73_m2} (ref >=60)
GFR, Est Non African American: 111 mL/min/{1.73_m2} (ref >=60)
Globulin: 2.1 g/dL (calc) (ref 1.9–3.7)
Glucose, Bld: 105 mg/dL — ABNORMAL HIGH (ref 65–99)
Potassium: 4.5 mmol/L (ref 3.5–5.3)
Sodium: 138 mmol/L (ref 135–146)
Total Bilirubin: 0.5 mg/dL (ref 0.2–1.2)
Total Protein: 6.3 g/dL (ref 6.1–8.1)

## 2018-01-16 LAB — CBC WITH DIFFERENTIAL/PLATELET
Basophils Absolute: 59 cells/uL (ref 0–200)
Basophils Relative: 1.1 %
Eosinophils Absolute: 189 cells/uL (ref 15–500)
Eosinophils Relative: 3.5 %
HCT: 40.2 % (ref 35.0–45.0)
Hemoglobin: 13.8 g/dL (ref 11.7–15.5)
Lymphs Abs: 1825 cells/uL (ref 850–3900)
MCH: 30.3 pg (ref 27.0–33.0)
MCHC: 34.3 g/dL (ref 32.0–36.0)
MCV: 88.2 fL (ref 80.0–100.0)
MPV: 10.4 fL (ref 7.5–12.5)
Monocytes Relative: 7.4 %
Neutro Abs: 2927 cells/uL (ref 1500–7800)
Neutrophils Relative %: 54.2 %
Platelets: 223 10*3/uL (ref 140–400)
RBC: 4.56 10*6/uL (ref 3.80–5.10)
RDW: 11.7 % (ref 11.0–15.0)
Total Lymphocyte: 33.8 %
WBC mixed population: 400 cells/uL (ref 200–950)
WBC: 5.4 10*3/uL (ref 3.8–10.8)

## 2018-01-16 LAB — TSH: TSH: 1.26 mIU/L

## 2018-01-16 LAB — VITAMIN D 25 HYDROXY (VIT D DEFICIENCY, FRACTURES): Vit D, 25-Hydroxy: 33 ng/mL (ref 30–100)

## 2018-01-16 LAB — LIPID PANEL
Cholesterol: 173 mg/dL (ref <200)
HDL: 50 mg/dL — ABNORMAL LOW (ref >50)
LDL Cholesterol (Calc): 109 mg/dL (calc) — ABNORMAL HIGH
Non-HDL Cholesterol (Calc): 123 mg/dL (calc) (ref <130)
Total CHOL/HDL Ratio: 3.5 (calc) (ref <5.0)
Triglycerides: 62 mg/dL (ref <150)

## 2018-01-16 NOTE — Patient Instructions (Addendum)
Flu vaccine given.  Return in early February for follow-up on elevated serum glucose, elevated LDL, mildly elevated liver functions.  Watch diet continue exercise program.  Take 2000 units vitamin D3 daily.  Give tetanus immunization update in February

## 2018-01-16 NOTE — Progress Notes (Signed)
Subjective:    Patient ID: Margaret Baker, female    DOB: [birth year], 42 y.o.   MRN: [id number]  HPI 42 year old Female for health maintenance exam and evaluation of medical issues.   History of herpes simplex type I and is taken Valtrex for that.  History of allergic rhinitis.  History of gross hematuria 2013 and outpatient work-up was negative.  She saw urologist and had cystoscopy with no evidence of source of bleeding.  Subsequently underwent upper tract evaluation under anesthesia.  There was a filling defect on pyelography.  On inspection it appeared that the renal papilla on the right had a bulbar shape with superficial vessels on each 1 of them.  There was a similar appearance on the left.  It was assumed that the source of bleeding was possibly on the left and the stent was placed and she has had no further recurrence of bleeding.  In August 2018 she was treated for UTI symptoms.  Urine culture revealed no growth.  Social history: Married.  Has never smoked.  Social alcohol consumption.  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  Family history: Mother with history of stroke and memory loss.  Father deceased with history of prostate cancer.  One brother alive in good health.      Review of Systems  Constitutional: Negative.   All other systems reviewed and are negative.      Objective:   Physical Exam  Constitutional: She is oriented to person, place, and time. She appears well-developed and well-nourished. No distress.  HENT:  Head: Normocephalic and atraumatic.  Left Ear: External ear normal.  Nose: Nose normal.  Mouth/Throat: Oropharynx is clear and moist. No oropharyngeal exudate.  Eyes: Pupils are equal, round, and reactive to light. Conjunctivae and EOM are normal. Right eye exhibits no discharge. Left eye exhibits no discharge.  Neck: Neck supple. No JVD present. No thyromegaly present.  Cardiovascular: Normal rate, regular rhythm,  normal heart sounds and intact distal pulses.  No murmur heard. Pulmonary/Chest: Effort normal and breath sounds normal. No stridor. No respiratory distress. She has no wheezes. She has no rales.  Breasts normal female  Abdominal: Soft. Bowel sounds are normal. She exhibits no distension and no mass. There is no tenderness. There is no rebound and no guarding.  Genitourinary:  Genitourinary Comments: Deferred to GYN  Musculoskeletal: She exhibits no edema.  Lymphadenopathy:    She has no cervical adenopathy.  Neurological: She is alert and oriented to person, place, and time. She displays normal reflexes. No cranial nerve deficit or sensory deficit. Coordination normal.  Skin: Skin is warm and dry. She is not diaphoretic.  Psychiatric: She has a normal mood and affect. Her behavior is normal. Judgment and thought content normal.  Vitals reviewed.         Assessment & Plan:  Elevated serum glucose of 105 and 1 year ago was 97  Elevated LDL of 109 and 1 year ago was 97  Mild elevation of SGOT and SGPT of 41 and 39 respectively and were normal 1 year ago.  Has not been drinking alcohol or taking excessive amounts of analgesic medication.  Has not had viral illness.  Follow-up in February.  Flu vaccine given.  She will return in early February for follow-up on elevated serum glucose, elevated LDL and mildly elevated liver functions.  She will watch her diet and continue exercise program.  Currently not taking over-the-counter vitamin D.  Level is 33.  Recommend 2000 units vitamin D3 daily.  Give tetanus immunization update in February

## 2018-03-19 ENCOUNTER — Encounter: Payer: Self-pay | Admitting: Internal Medicine

## 2018-03-19 ENCOUNTER — Ambulatory Visit
Admission: RE | Admit: 2018-03-19 | Discharge: 2018-03-19 | Disposition: A | Discharge status: Home / Self Care | Payer: BLUE CROSS/BLUE SHIELD | Source: Ambulatory Visit | Attending: Internal Medicine | Admitting: Internal Medicine

## 2018-03-19 ENCOUNTER — Ambulatory Visit: Payer: BLUE CROSS/BLUE SHIELD | Admitting: Internal Medicine

## 2018-03-19 VITALS — Ht 65.5 in

## 2018-03-19 DIAGNOSIS — S99911A Unspecified injury of right ankle, initial encounter: Secondary | ICD-10-CM | POA: Diagnosis not present

## 2018-03-19 DIAGNOSIS — M25571 Pain in right ankle and joints of right foot: Secondary | ICD-10-CM | POA: Diagnosis not present

## 2018-03-19 DIAGNOSIS — S8261XA Displaced fracture of lateral malleolus of right fibula, initial encounter for closed fracture: Secondary | ICD-10-CM | POA: Diagnosis not present

## 2018-03-19 DIAGNOSIS — J0100 Acute maxillary sinusitis, unspecified: Secondary | ICD-10-CM | POA: Diagnosis not present

## 2018-03-19 DIAGNOSIS — S93401A Sprain of unspecified ligament of right ankle, initial encounter: Secondary | ICD-10-CM | POA: Diagnosis not present

## 2018-03-19 MED ORDER — AZITHROMYCIN 250 MG PO TABS: ORAL_TABLET | ORAL | 0 refills | Status: DC

## 2018-03-19 MED ORDER — HYDROCODONE-ACETAMINOPHEN 5-325 MG PO TABS: 1.0000 | ORAL_TABLET | Freq: Four times a day (QID) | ORAL | 0 refills | Status: DC | PRN

## 2018-03-19 NOTE — Patient Instructions (Signed)
To have x-rays of right ankle and foot.  Norco 5/325 every 6 hours sparingly for pain.  Ice and elevation of right ankle.  Zithromax Z-PAK for acute maxillary sinusitis.

## 2018-03-19 NOTE — Progress Notes (Addendum)
   Subjective:    Patient ID: Margaret Baker, female    DOB: 1976-02-04, 43 y.o.   MRN: [id number]  HPI 43 year old Female initially came for respiratory infection going on since around January 1.  Has had discolored sputum and nasal drainage.  No fever or shaking chills or flulike illness.  Sounds nasally congested.  Today, while jumping rope, she acutely everted her right ankle and foot.  She has a history of right ankle surgery by Dr. Victorino Dike in 2011.  The foot is red and swollen and tender to touch.  Ankle is also tender along right lateral malleolus.    Review of Systems see above     Objective:   Physical Exam  She has erythema right foot extending to right ankle.  Right lateral malleolus is tender.  Right fourth metatarsal is tender.  Pharynx is slightly injected.  TMs are clear.  She sounds nasally congested.  Neck supple.  No adenopathy.  Chest clear to auscultation.      Assessment & Plan:  Acute right ankle and foot eversion injury-to have x-rays immediately.  Apply ice and elevation.  Given Ace wrap.  Acute maxillary sinusitis  Plan: Zithromax Z-PAK take 2 tablets day 1 followed by 1 tablet days 2 through 5.  Norco 5/325 (#20) one po q 6 hours prn acute ankle pain  ADD: contacted pt- Xrays are negative. Suggest ice and elevation of foot.

## 2018-04-24 ENCOUNTER — Other Ambulatory Visit: Payer: BLUE CROSS/BLUE SHIELD | Admitting: Internal Medicine

## 2018-04-24 DIAGNOSIS — R945 Abnormal results of liver function studies: Principal | ICD-10-CM

## 2018-04-24 DIAGNOSIS — R739 Hyperglycemia, unspecified: Secondary | ICD-10-CM

## 2018-04-24 DIAGNOSIS — E7800 Pure hypercholesterolemia, unspecified: Secondary | ICD-10-CM | POA: Diagnosis not present

## 2018-04-24 DIAGNOSIS — R7989 Other specified abnormal findings of blood chemistry: Secondary | ICD-10-CM

## 2018-04-25 LAB — HEPATIC FUNCTION PANEL
AG Ratio: 2 (calc) (ref 1.0–2.5)
ALT: 28 U/L (ref 6–29)
AST: 22 U/L (ref 10–30)
Albumin: 4.3 g/dL (ref 3.6–5.1)
Alkaline phosphatase (APISO): 61 U/L (ref 31–125)
Bilirubin, Direct: 0.1 mg/dL (ref 0.0–0.2)
Globulin: 2.2 g/dL (calc) (ref 1.9–3.7)
Indirect Bilirubin: 0.4 mg/dL (calc) (ref 0.2–1.2)
Total Bilirubin: 0.5 mg/dL (ref 0.2–1.2)
Total Protein: 6.5 g/dL (ref 6.1–8.1)

## 2018-04-25 LAB — LIPID PANEL
Cholesterol: 191 mg/dL (ref <200)
HDL: 53 mg/dL (ref >=50)
LDL Cholesterol (Calc): 120 mg/dL (calc) — ABNORMAL HIGH
Non-HDL Cholesterol (Calc): 138 mg/dL (calc) — ABNORMAL HIGH (ref <130)
Total CHOL/HDL Ratio: 3.6 (calc) (ref <5.0)
Triglycerides: 85 mg/dL (ref <150)

## 2018-04-25 LAB — HEMOGLOBIN A1C
Hgb A1c MFr Bld: 5.9 % of total Hgb — ABNORMAL HIGH (ref <5.7)
Mean Plasma Glucose: 123 (calc)
eAG (mmol/L): 6.8 (calc)

## 2018-04-28 ENCOUNTER — Encounter: Payer: Self-pay | Admitting: Internal Medicine

## 2018-04-28 ENCOUNTER — Ambulatory Visit: Payer: BLUE CROSS/BLUE SHIELD | Admitting: Internal Medicine

## 2018-05-15 ENCOUNTER — Ambulatory Visit: Payer: BLUE CROSS/BLUE SHIELD | Admitting: Internal Medicine

## 2018-05-15 ENCOUNTER — Encounter: Payer: Self-pay | Admitting: Internal Medicine

## 2018-05-15 ENCOUNTER — Ambulatory Visit (INDEPENDENT_AMBULATORY_CARE_PROVIDER_SITE_OTHER): Payer: BLUE CROSS/BLUE SHIELD | Admitting: Internal Medicine

## 2018-05-15 ENCOUNTER — Telehealth: Payer: Self-pay | Admitting: Internal Medicine

## 2018-05-15 DIAGNOSIS — E7800 Pure hypercholesterolemia, unspecified: Secondary | ICD-10-CM | POA: Diagnosis not present

## 2018-05-15 DIAGNOSIS — R7302 Impaired glucose tolerance (oral): Secondary | ICD-10-CM

## 2018-05-15 NOTE — Progress Notes (Signed)
   Subjective:    Patient ID: Margaret Baker, female    DOB: November 07, 1975, 43 y.o.   MRN: [id number]  HPI She was seen in November for routine health maintenance exam.  Her glucose was mildly elevated at 105 and her lipid panel showed an LDL of 109.  We agreed she would work on diet and exercise and follow-up in February. She has mild glucose intolerance with hemoglobin A1c done on February 14 at 5.9% Unfortunately LDL has increased from 109 to 120  Review of Systems     Objective:   Physical Exam  Not examined but spent 15 minutes speaking with her about these issues today.      Assessment & Plan:  Impaired glucose tolerance on 5.9%  Elevated LDL of 120  Plan: She does not want to be on medication and would prefer to work on diet and exercise.  Suggest follow-up in 6 months.

## 2018-05-15 NOTE — Telephone Encounter (Signed)
Latter mailed re: missed appt. To discuss lab results

## 2018-05-15 NOTE — Progress Notes (Deleted)
[facility] for follow up of elevated LDL cholesterol and impaired glucose tolerance.MJB

## 2018-05-19 DIAGNOSIS — Z01419 Encounter for gynecological examination (general) (routine) without abnormal findings: Secondary | ICD-10-CM | POA: Diagnosis not present

## 2018-05-19 DIAGNOSIS — Z1231 Encounter for screening mammogram for malignant neoplasm of breast: Secondary | ICD-10-CM | POA: Diagnosis not present

## 2018-05-19 DIAGNOSIS — Z6829 Body mass index (BMI) 29.0-29.9, adult: Secondary | ICD-10-CM | POA: Diagnosis not present

## 2018-05-28 ENCOUNTER — Encounter (INDEPENDENT_AMBULATORY_CARE_PROVIDER_SITE_OTHER): Payer: BLUE CROSS/BLUE SHIELD

## 2018-06-01 ENCOUNTER — Ambulatory Visit (INDEPENDENT_AMBULATORY_CARE_PROVIDER_SITE_OTHER): Payer: BLUE CROSS/BLUE SHIELD | Admitting: Family Medicine

## 2018-06-09 ENCOUNTER — Encounter: Payer: Self-pay | Admitting: Internal Medicine

## 2018-06-09 DIAGNOSIS — E7800 Pure hypercholesterolemia, unspecified: Secondary | ICD-10-CM | POA: Insufficient documentation

## 2018-06-09 DIAGNOSIS — R7302 Impaired glucose tolerance (oral): Secondary | ICD-10-CM | POA: Insufficient documentation

## 2018-06-09 NOTE — Patient Instructions (Signed)
Work on diet and exercise and follow-up in 6 months.

## 2018-06-10 ENCOUNTER — Ambulatory Visit (INDEPENDENT_AMBULATORY_CARE_PROVIDER_SITE_OTHER): Payer: BLUE CROSS/BLUE SHIELD | Admitting: Family Medicine

## 2018-07-27 DIAGNOSIS — Z03818 Encounter for observation for suspected exposure to other biological agents ruled out: Secondary | ICD-10-CM | POA: Diagnosis not present

## 2018-09-20 DIAGNOSIS — N390 Urinary tract infection, site not specified: Secondary | ICD-10-CM | POA: Diagnosis not present

## 2019-06-03 DIAGNOSIS — Z1151 Encounter for screening for human papillomavirus (HPV): Secondary | ICD-10-CM | POA: Diagnosis not present

## 2019-06-03 DIAGNOSIS — Z01419 Encounter for gynecological examination (general) (routine) without abnormal findings: Secondary | ICD-10-CM | POA: Diagnosis not present

## 2019-06-03 DIAGNOSIS — Z6823 Body mass index (BMI) 23.0-23.9, adult: Secondary | ICD-10-CM | POA: Diagnosis not present

## 2019-06-03 DIAGNOSIS — Z1231 Encounter for screening mammogram for malignant neoplasm of breast: Secondary | ICD-10-CM | POA: Diagnosis not present

## 2019-06-12 DIAGNOSIS — B001 Herpesviral vesicular dermatitis: Secondary | ICD-10-CM | POA: Diagnosis not present

## 2019-07-16 IMAGING — CR DG ANKLE COMPLETE 3+V*R*
3 series · 3 of 3 positions shown · non-contrast
Comparison: 01/12/2010

CLINICAL DATA: Inversion twist of RIGHT ankle this AM while jumping
rope / ZEINAB / unable to bear weight / remote ORIF / concern for FX
and hardware stability

EXAM:
RIGHT ANKLE - COMPLETE 3+ VIEW

[x ankle lat right]
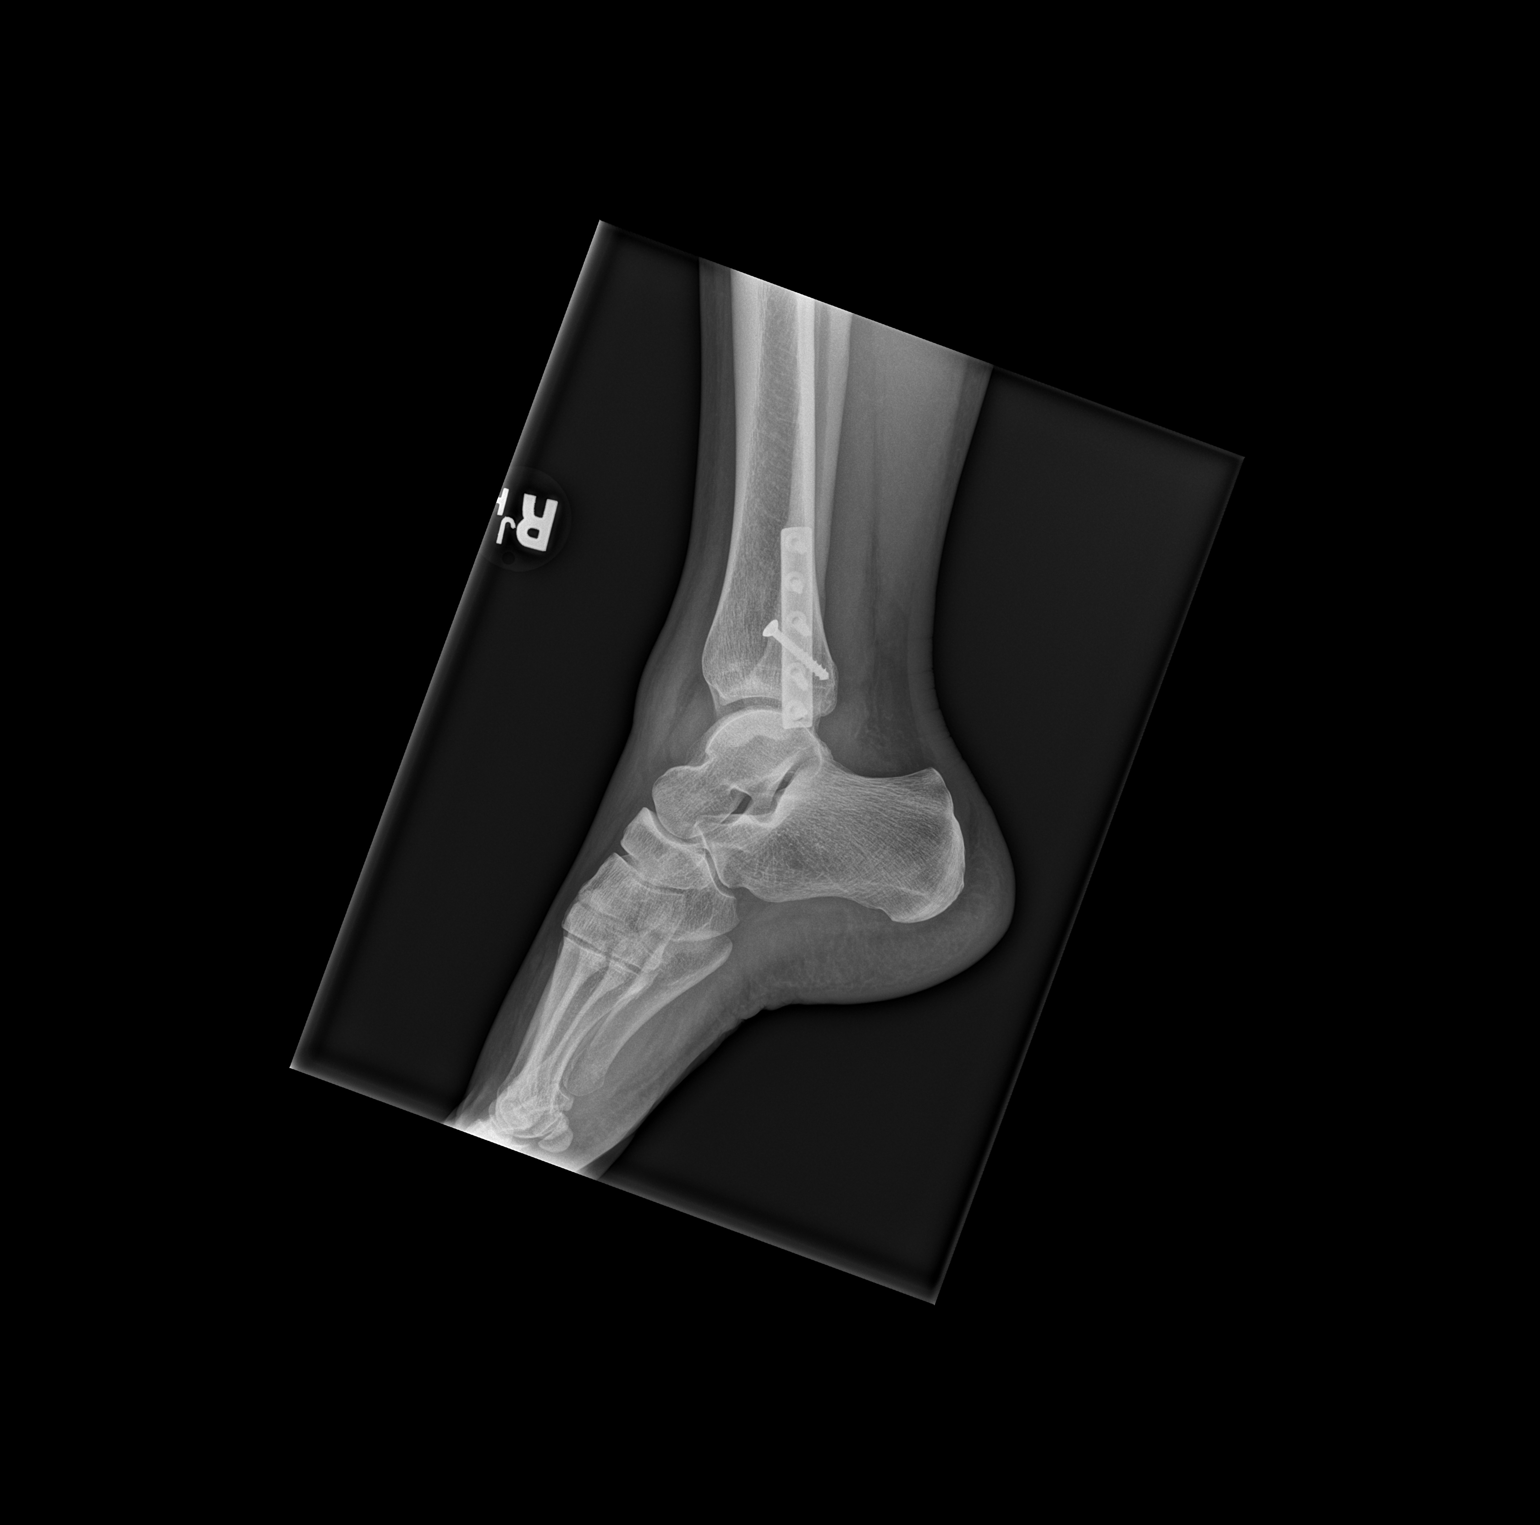

[x ankle ap right]
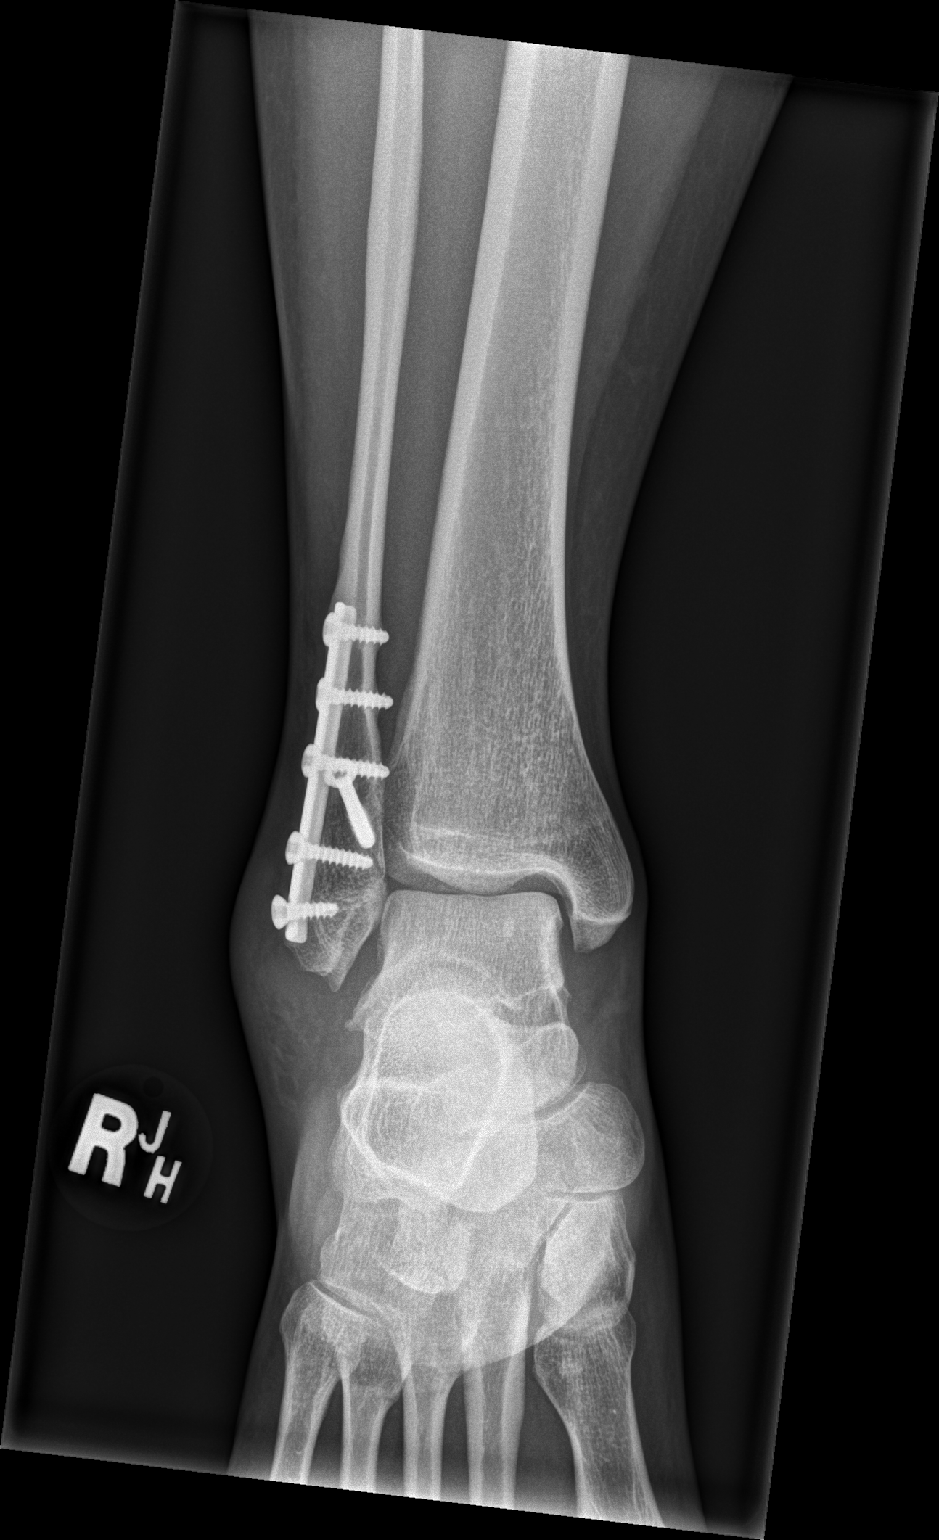

[x ankle obl right]
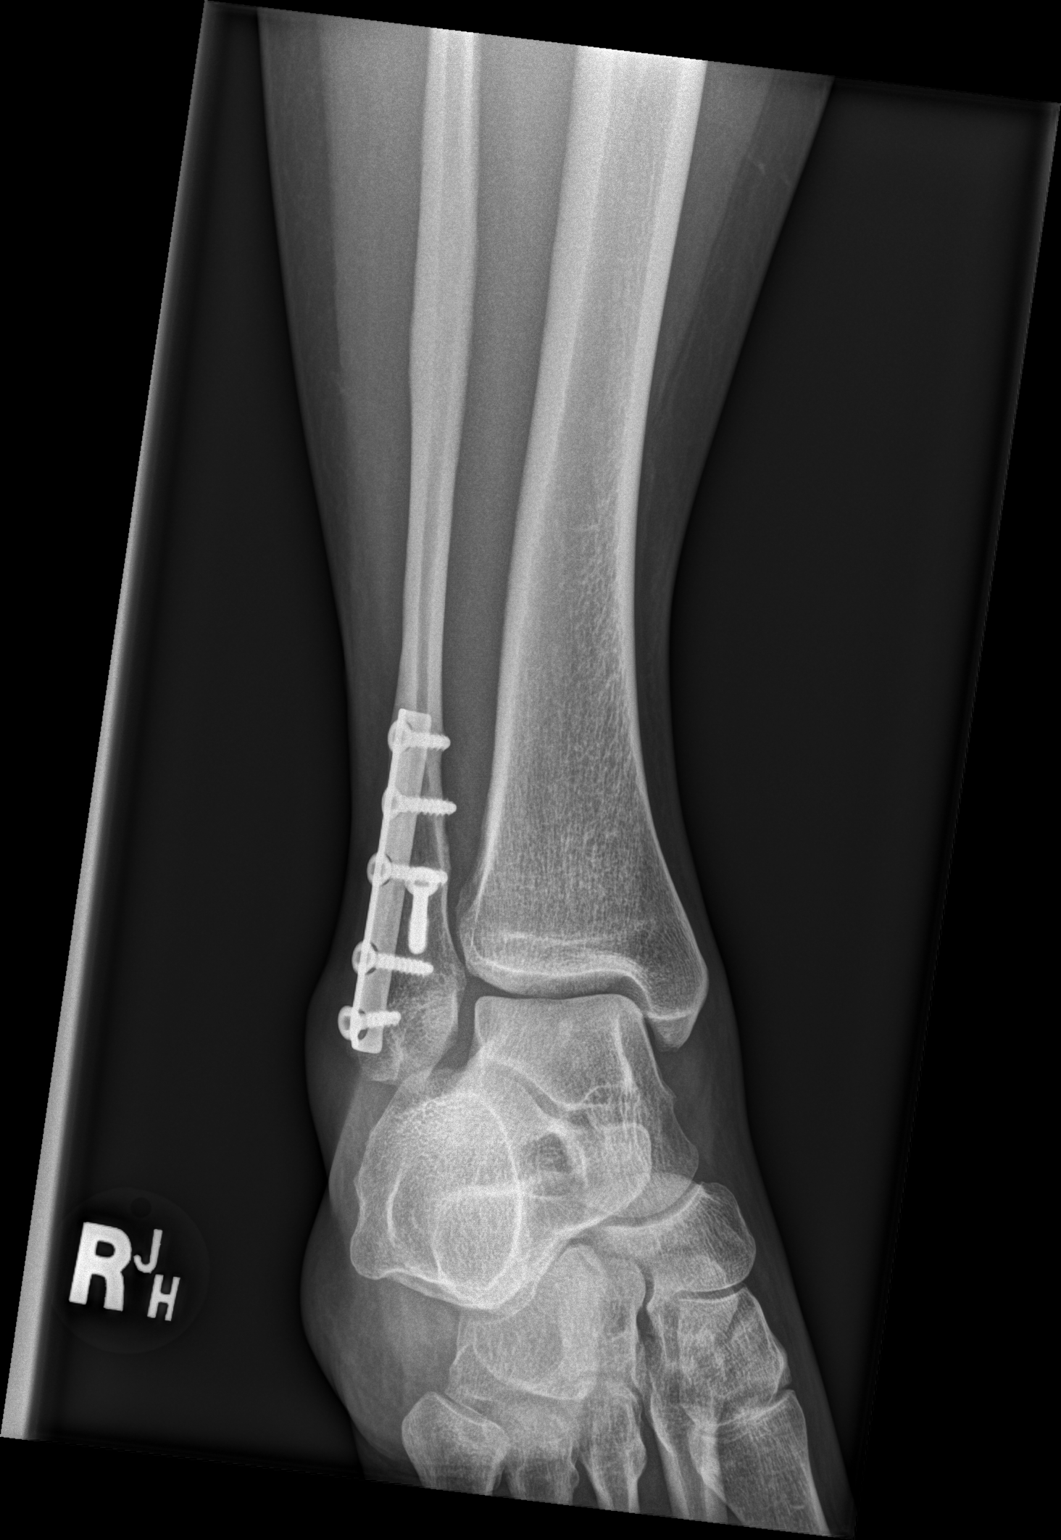

[3 of 3 positions shown; findings below may reference images not displayed]

FINDINGS: No acute fracture.

Lateral fixation plate and fixation screws extend across the distal
fibula. The orthopedic hardware is well-seated with no evidence of
loosening. Old fibular fracture is well-healed.

Ankle mortise is normally spaced and aligned.

There is soft tissue swelling that predominates antral laterally.
IMPRESSION: 1. No fracture. No evidence of loosening of the distal fibular
orthopedic hardware. Ankle joint normally aligned.

## 2019-07-16 IMAGING — CR DG FOOT COMPLETE 3+V*R*
3 series · 3 of 3 positions shown · non-contrast
Comparison: 01/12/2010.

CLINICAL DATA: Right ankle inversion.

EXAM:
RIGHT FOOT COMPLETE - 3+ VIEW

[x foot ap right]
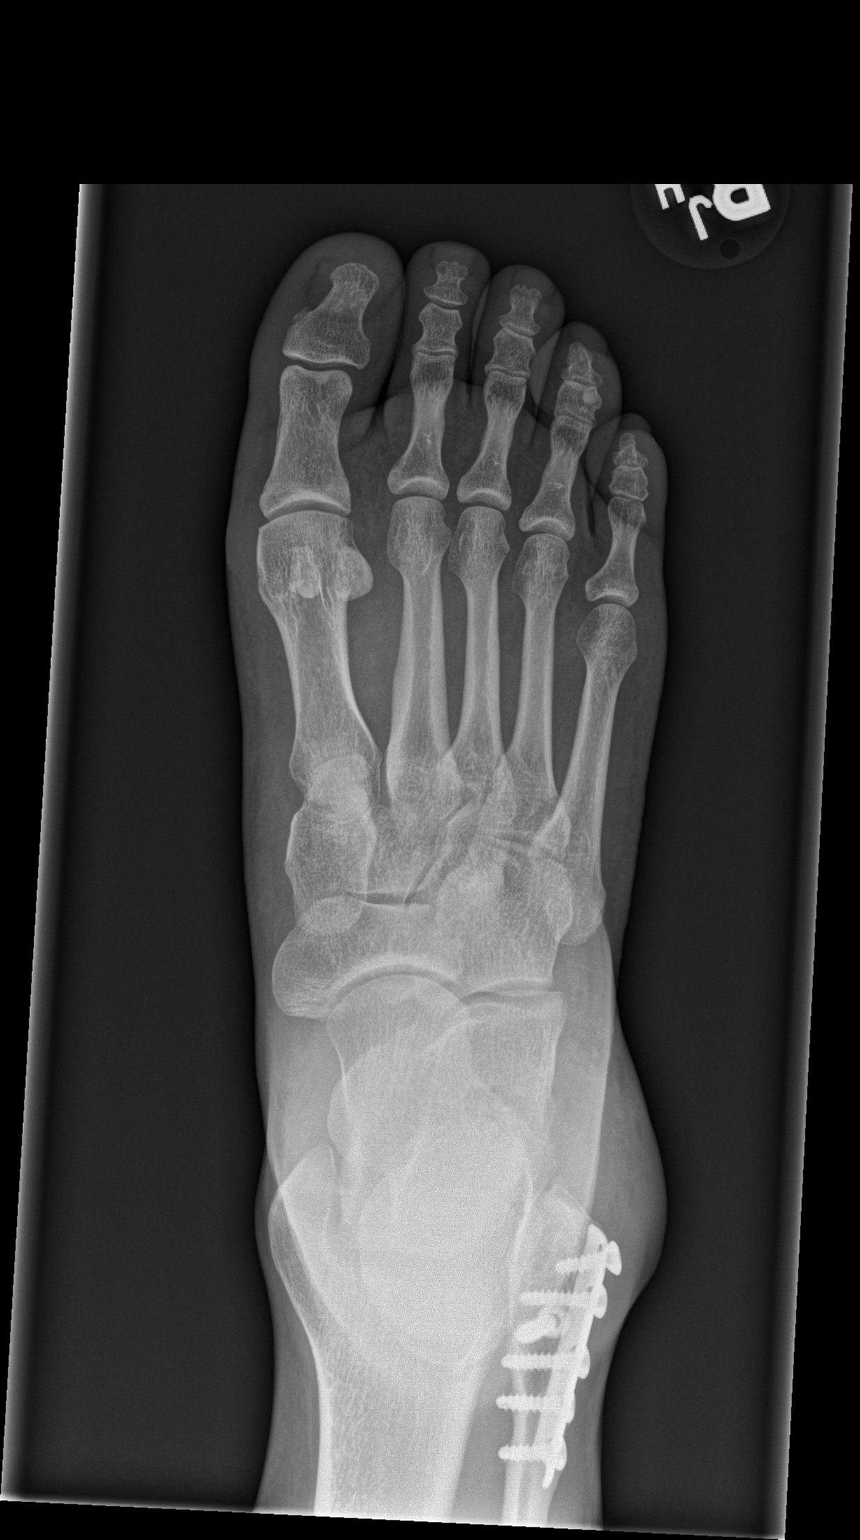

[x foot obl right]
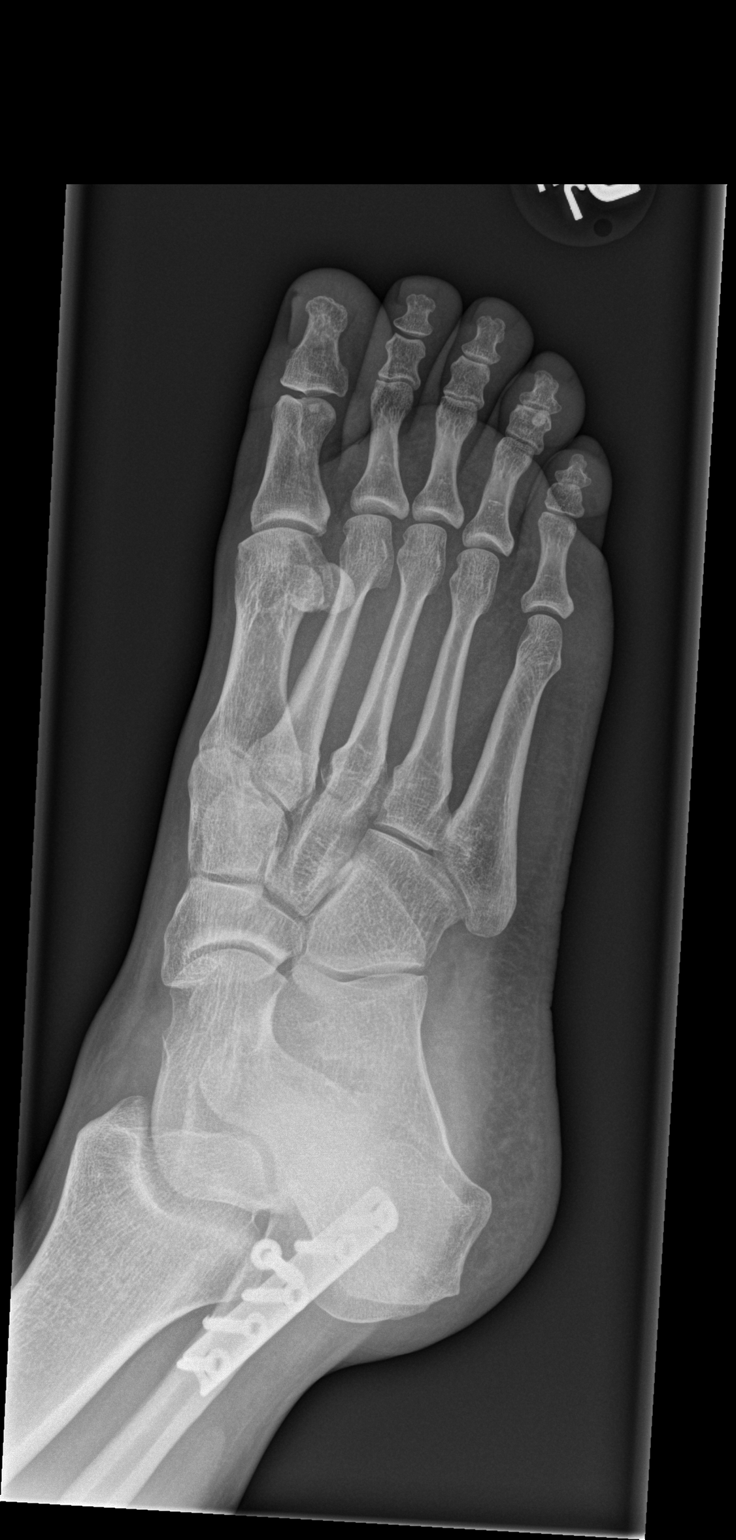

[x foot lat right]
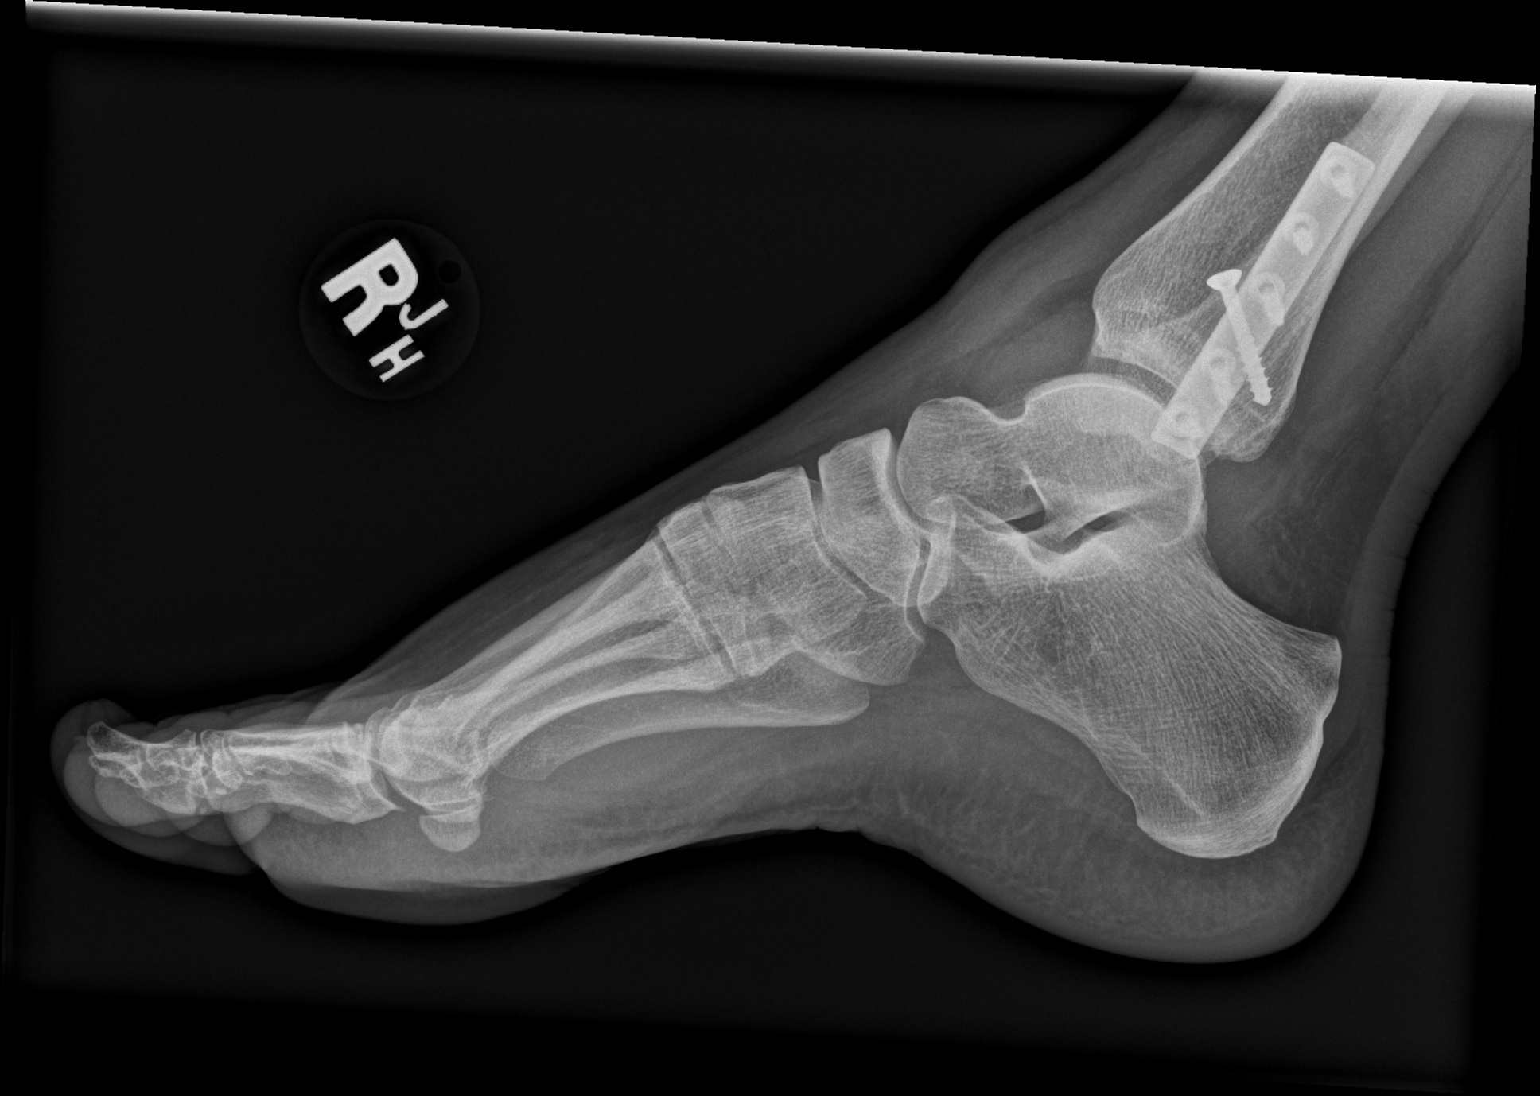

[3 of 3 positions shown; findings below may reference images not displayed]

FINDINGS: Soft tissue swelling over the lateral malleolus. Bony abnormality.
No evidence of fracture dislocation. Plate and screw fixation of the
lateral malleolus noted.
IMPRESSION: 1. Plate and screw fixation of the lateral malleolus. Hardware
intact. Soft tissue swelling over the lateral malleolus.

2.  No acute bony abnormality.

## 2019-10-09 DIAGNOSIS — Z20822 Contact with and (suspected) exposure to covid-19: Secondary | ICD-10-CM | POA: Diagnosis not present

## 2019-11-22 DIAGNOSIS — Z20822 Contact with and (suspected) exposure to covid-19: Secondary | ICD-10-CM | POA: Diagnosis not present

## 2020-01-21 DIAGNOSIS — Z20822 Contact with and (suspected) exposure to covid-19: Secondary | ICD-10-CM | POA: Diagnosis not present

## 2020-06-20 DIAGNOSIS — Z01419 Encounter for gynecological examination (general) (routine) without abnormal findings: Secondary | ICD-10-CM | POA: Diagnosis not present

## 2020-06-20 DIAGNOSIS — Z6823 Body mass index (BMI) 23.0-23.9, adult: Secondary | ICD-10-CM | POA: Diagnosis not present

## 2020-06-20 DIAGNOSIS — Z1231 Encounter for screening mammogram for malignant neoplasm of breast: Secondary | ICD-10-CM | POA: Diagnosis not present

## 2021-02-28 DIAGNOSIS — J029 Acute pharyngitis, unspecified: Secondary | ICD-10-CM | POA: Diagnosis not present

## 2021-02-28 DIAGNOSIS — R0981 Nasal congestion: Secondary | ICD-10-CM | POA: Diagnosis not present

## 2021-02-28 DIAGNOSIS — U071 COVID-19: Secondary | ICD-10-CM | POA: Diagnosis not present

## 2021-03-20 DIAGNOSIS — D1801 Hemangioma of skin and subcutaneous tissue: Secondary | ICD-10-CM | POA: Diagnosis not present

## 2021-03-20 DIAGNOSIS — L570 Actinic keratosis: Secondary | ICD-10-CM | POA: Diagnosis not present

## 2021-03-20 DIAGNOSIS — L812 Freckles: Secondary | ICD-10-CM | POA: Diagnosis not present

## 2021-03-20 DIAGNOSIS — L814 Other melanin hyperpigmentation: Secondary | ICD-10-CM | POA: Diagnosis not present

## 2021-09-25 DIAGNOSIS — Z1231 Encounter for screening mammogram for malignant neoplasm of breast: Secondary | ICD-10-CM | POA: Diagnosis not present

## 2021-09-25 DIAGNOSIS — Z6825 Body mass index (BMI) 25.0-25.9, adult: Secondary | ICD-10-CM | POA: Diagnosis not present

## 2021-09-25 DIAGNOSIS — Z01419 Encounter for gynecological examination (general) (routine) without abnormal findings: Secondary | ICD-10-CM | POA: Diagnosis not present

## 2021-10-24 DIAGNOSIS — Z1211 Encounter for screening for malignant neoplasm of colon: Secondary | ICD-10-CM | POA: Diagnosis not present

## 2021-10-24 DIAGNOSIS — Z1212 Encounter for screening for malignant neoplasm of rectum: Secondary | ICD-10-CM | POA: Diagnosis not present

## 2021-11-01 LAB — EXTERNAL GENERIC LAB PROCEDURE: COLOGUARD: NEGATIVE

## 2022-03-26 DIAGNOSIS — L578 Other skin changes due to chronic exposure to nonionizing radiation: Secondary | ICD-10-CM | POA: Diagnosis not present

## 2022-03-26 DIAGNOSIS — D1801 Hemangioma of skin and subcutaneous tissue: Secondary | ICD-10-CM | POA: Diagnosis not present

## 2022-03-26 DIAGNOSIS — L814 Other melanin hyperpigmentation: Secondary | ICD-10-CM | POA: Diagnosis not present

## 2022-03-26 DIAGNOSIS — L812 Freckles: Secondary | ICD-10-CM | POA: Diagnosis not present

## 2022-03-26 DIAGNOSIS — L570 Actinic keratosis: Secondary | ICD-10-CM | POA: Diagnosis not present

## 2022-04-08 ENCOUNTER — Telehealth: Payer: Self-pay | Admitting: Internal Medicine

## 2022-04-08 NOTE — Telephone Encounter (Signed)
Scheduled for Thursday.

## 2022-04-08 NOTE — Telephone Encounter (Signed)
Pachia Sobczyk [phone]  Steve called to see if she could be seen for both her hands feeling frozen in the morning, she also said she has numbness and tingling in her left thumb and index finger since November.   Last OV 05/15/18 - your name has been removed as her PCP

## 2022-04-11 ENCOUNTER — Ambulatory Visit: Payer: BC Managed Care – PPO | Admitting: Internal Medicine

## 2022-04-11 ENCOUNTER — Encounter: Payer: Self-pay | Admitting: Internal Medicine

## 2022-04-11 VITALS — BP 110/80 | HR 78 | Temp 97.9°F | Ht 65.5 in | Wt 169.8 lb

## 2022-04-11 DIAGNOSIS — M79641 Pain in right hand: Secondary | ICD-10-CM

## 2022-04-11 DIAGNOSIS — R202 Paresthesia of skin: Secondary | ICD-10-CM | POA: Diagnosis not present

## 2022-04-11 DIAGNOSIS — M79642 Pain in left hand: Secondary | ICD-10-CM | POA: Diagnosis not present

## 2022-04-11 NOTE — Progress Notes (Addendum)
Patient Care Team: Elby Showers, MD as PCP - General (Internal Medicine)  Visit Date: 04/11/22  Subjective:    Patient ID: Margaret Baker , Female   DOB: 07-05-1975, 47 y.o.    MRN: [id number]   47 y.o. Female presents today for finger stiffness and numbness. Patient has a past medical history of gross hematuria.  Reports numbness and tingling in left index finger since 11/23. Her fingers and hands are stiff and painful and have trigger finger symptoms upon waking up in the morning. This does not interrupt her daily activities but it makes hand movements slower in the morning. Denies any heavy lifting, falls, head/neck injuries leading up to onset of symptoms. She is right-handed. She has tried Aleve and Advil with pain relief but no relief of stiffness. Denies difficulty swallowing.  She reports having a choking episode in November shortly after her hand/finger symptoms began, after which she had a sore throat that has resolved.   Past Medical History:  Diagnosis Date   Gross hematuria    Hx of allergic rhinitis, Hx of HSV Type 1,  gross hematuria workup was negative for cancer in 2013 including cystoscopy It was thought to be due to bleeding from renal pappilae. A stent was placed. No further issues. Had UTI 2018  Family History  Problem Relation Age of Onset   Stroke Mother    Memory loss Mother    Prostate cancer Father     Social Hx: never smoked. Married. Social alcohol consumption.     Review of Systems  Constitutional:  Negative for fever and malaise/fatigue.  HENT:  Negative for congestion.   Eyes:  Negative for blurred vision.  Respiratory:  Negative for cough and shortness of breath.   Cardiovascular:  Negative for chest pain, palpitations and leg swelling.  Gastrointestinal:  Negative for vomiting.  Musculoskeletal:  Negative for back pain.       (+) Finger stiffness, pain, trigger fingers after waking up in the morning  Skin:  Negative for rash.   Neurological:  Positive for tingling (Left index finger). Negative for loss of consciousness and headaches.       (+) Numbness and tingling in left index finger after waking up in the morning        Objective:   Vitals: BP 110/80   Pulse 78   Temp 97.9 F (36.6 C) (Tympanic)   Ht 5' 5.5" (1.664 m)   Wt 169 lb 12.8 oz (77 kg)   SpO2 99%   BMI 27.83 kg/m    Physical Exam Vitals and nursing note reviewed.  Constitutional:      General: She is not in acute distress.    Appearance: Normal appearance. She is not ill-appearing or toxic-appearing.  HENT:     Head: Normocephalic and atraumatic.  Eyes:     Extraocular Movements: Extraocular movements intact.     Pupils: Pupils are equal, round, and reactive to light.  Pulmonary:     Effort: Pulmonary effort is normal.  Musculoskeletal:     Comments: Decreased temperature sensation in left index finger. Full temperature sensation in left forearm.  Skin:    General: Skin is warm and dry.  Neurological:     Mental Status: She is alert and oriented to person, place, and time. Mental status is at baseline.  Psychiatric:        Mood and Affect: Mood normal.        Behavior: Behavior normal.  Thought Content: Thought content normal.        Judgment: Judgment normal.       Results:   Studies obtained and personally reviewed by me:    Labs reviewed      Component Value Date/Time   NA 138 01/15/2018 1059   K 4.5 01/15/2018 1059   CL 105 01/15/2018 1059   CO2 26 01/15/2018 1059   GLUCOSE 105 (H) 01/15/2018 1059   BUN 16 01/15/2018 1059   CREATININE 0.64 01/15/2018 1059   CALCIUM 9.4 01/15/2018 1059   PROT 6.5 04/24/2018 1213   ALBUMIN 4.7 03/13/2011 1242   AST 22 04/24/2018 1213   ALT 28 04/24/2018 1213   ALKPHOS 59 03/13/2011 1242   BILITOT 0.5 04/24/2018 1213   GFRNONAA 111 01/15/2018 1059   GFRAA 128 01/15/2018 1059     Lab Results  Component Value Date   WBC 5.4 01/15/2018   HGB 13.8 01/15/2018    HCT 40.2 01/15/2018   MCV 88.2 01/15/2018   PLT 223 01/15/2018    Lab Results  Component Value Date   CHOL 191 04/24/2018   HDL 53 04/24/2018   LDLCALC 120 (H) 04/24/2018   TRIG 85 04/24/2018   CHOLHDL 3.6 04/24/2018    Lab Results  Component Value Date   HGBA1C 5.9 (H) 04/24/2018     Lab Results  Component Value Date   TSH 1.26 01/15/2018      Assessment & Plan:   Numbness/tingling in hands/fingers:  Labs reviewed. Referred to Neurology Clinic for neck imaging and evaluation. May have cervical spinal stenosis.    I,Alexander Ruley,acting as a Education administrator for Elby Showers, MD.,have documented all relevant documentation on the behalf of Elby Showers, MD,as directed by  Elby Showers, MD while in the presence of Elby Showers, MD.   I, Elby Showers, MD, have reviewed all documentation for this visit. The documentation on 04/16/22 for the exam, diagnosis, procedures, and orders are all accurate and complete.

## 2022-04-12 ENCOUNTER — Other Ambulatory Visit: Payer: BC Managed Care – PPO

## 2022-04-12 DIAGNOSIS — R5383 Other fatigue: Secondary | ICD-10-CM | POA: Diagnosis not present

## 2022-04-12 DIAGNOSIS — Z13220 Encounter for screening for lipoid disorders: Secondary | ICD-10-CM

## 2022-04-12 DIAGNOSIS — M79641 Pain in right hand: Secondary | ICD-10-CM | POA: Diagnosis not present

## 2022-04-12 DIAGNOSIS — R202 Paresthesia of skin: Secondary | ICD-10-CM | POA: Diagnosis not present

## 2022-04-12 DIAGNOSIS — Z136 Encounter for screening for cardiovascular disorders: Secondary | ICD-10-CM | POA: Diagnosis not present

## 2022-04-13 LAB — CBC WITH DIFFERENTIAL/PLATELET
Absolute Monocytes: 291 cells/uL (ref 200–950)
Basophils Absolute: 42 cells/uL (ref 0–200)
Basophils Relative: 0.9 %
Eosinophils Absolute: 150 cells/uL (ref 15–500)
Eosinophils Relative: 3.2 %
HCT: 41.3 % (ref 35.0–45.0)
Hemoglobin: 14.3 g/dL (ref 11.7–15.5)
Lymphs Abs: 1936 cells/uL (ref 850–3900)
MCH: 30.7 pg (ref 27.0–33.0)
MCHC: 34.6 g/dL (ref 32.0–36.0)
MCV: 88.6 fL (ref 80.0–100.0)
MPV: 9.9 fL (ref 7.5–12.5)
Monocytes Relative: 6.2 %
Neutro Abs: 2280 cells/uL (ref 1500–7800)
Neutrophils Relative %: 48.5 %
Platelets: 252 10*3/uL (ref 140–400)
RBC: 4.66 10*6/uL (ref 3.80–5.10)
RDW: 11.6 % (ref 11.0–15.0)
Total Lymphocyte: 41.2 %
WBC: 4.7 10*3/uL (ref 3.8–10.8)

## 2022-04-13 LAB — COMPLETE METABOLIC PANEL WITH GFR
AG Ratio: 1.8 (calc) (ref 1.0–2.5)
ALT: 22 U/L (ref 6–29)
AST: 19 U/L (ref 10–35)
Albumin: 4.3 g/dL (ref 3.6–5.1)
Alkaline phosphatase (APISO): 51 U/L (ref 31–125)
BUN: 16 mg/dL (ref 7–25)
CO2: 30 mmol/L (ref 20–32)
Calcium: 9.5 mg/dL (ref 8.6–10.2)
Chloride: 104 mmol/L (ref 98–110)
Creat: 0.64 mg/dL (ref 0.50–0.99)
Globulin: 2.4 g/dL (calc) (ref 1.9–3.7)
Glucose, Bld: 106 mg/dL — ABNORMAL HIGH (ref 65–99)
Potassium: 4.9 mmol/L (ref 3.5–5.3)
Sodium: 138 mmol/L (ref 135–146)
Total Bilirubin: 0.6 mg/dL (ref 0.2–1.2)
Total Protein: 6.7 g/dL (ref 6.1–8.1)
eGFR: 111 mL/min/{1.73_m2} (ref >=60)

## 2022-04-13 LAB — B12 AND FOLATE PANEL
Folate: 19.6 ng/mL
Vitamin B-12: 594 pg/mL (ref 200–1100)

## 2022-04-13 LAB — SEDIMENTATION RATE: Sed Rate: 6 mm/h (ref 0–20)

## 2022-04-13 LAB — TSH: TSH: 1.84 mIU/L

## 2022-04-13 LAB — T4, FREE: Free T4: 1.2 ng/dL (ref 0.8–1.8)

## 2022-04-16 ENCOUNTER — Telehealth: Payer: Self-pay | Admitting: Internal Medicine

## 2022-04-16 NOTE — Patient Instructions (Signed)
Referral to Neurology clinic to evaluate for possible cervical spinal stenosis vs carpal tunnel syndrome

## 2022-04-16 NOTE — Progress Notes (Signed)
I have placed a referral to Dr Lavell Anchors the neurologist of patients choice.

## 2022-04-16 NOTE — Telephone Encounter (Signed)
LVM to CB and let me know if she wants to go to [facility] or [facility] for neurology, if she wants [facility], Dr Renold Genta would like her to see Dr Jaynee Eagles or Dr Felecia Shelling.

## 2022-04-16 NOTE — Telephone Encounter (Signed)
Margaret Baker called back and wants to go to Dr Lavell Anchors at [facility]. I have placed the referral.

## 2022-04-25 ENCOUNTER — Encounter: Payer: Self-pay | Admitting: Internal Medicine

## 2022-04-25 NOTE — Telephone Encounter (Signed)
Margaret Baker wants this lab for the pain in her hands

## 2022-04-30 ENCOUNTER — Other Ambulatory Visit: Payer: BC Managed Care – PPO

## 2022-04-30 DIAGNOSIS — M79641 Pain in right hand: Secondary | ICD-10-CM | POA: Diagnosis not present

## 2022-04-30 DIAGNOSIS — M79642 Pain in left hand: Secondary | ICD-10-CM | POA: Diagnosis not present

## 2022-05-02 LAB — CYCLIC CITRUL PEPTIDE ANTIBODY, IGG: Cyclic Citrullin Peptide Ab: <16 UNITS

## 2022-05-27 DIAGNOSIS — R509 Fever, unspecified: Secondary | ICD-10-CM | POA: Diagnosis not present

## 2022-05-27 DIAGNOSIS — J3489 Other specified disorders of nose and nasal sinuses: Secondary | ICD-10-CM | POA: Diagnosis not present

## 2022-06-25 DIAGNOSIS — R200 Anesthesia of skin: Secondary | ICD-10-CM | POA: Diagnosis not present

## 2022-06-25 DIAGNOSIS — R202 Paresthesia of skin: Secondary | ICD-10-CM | POA: Diagnosis not present

## 2022-06-25 DIAGNOSIS — M25649 Stiffness of unspecified hand, not elsewhere classified: Secondary | ICD-10-CM | POA: Diagnosis not present

## 2022-07-08 DIAGNOSIS — H11152 Pinguecula, left eye: Secondary | ICD-10-CM | POA: Diagnosis not present

## 2022-07-08 DIAGNOSIS — H5203 Hypermetropia, bilateral: Secondary | ICD-10-CM | POA: Diagnosis not present

## 2022-07-08 DIAGNOSIS — H524 Presbyopia: Secondary | ICD-10-CM | POA: Diagnosis not present

## 2023-01-10 LAB — HM MAMMOGRAPHY

## 2023-06-03 ENCOUNTER — Encounter: Payer: Self-pay | Admitting: Internal Medicine

## 2023-06-03 ENCOUNTER — Ambulatory Visit: Payer: PRIVATE HEALTH INSURANCE | Admitting: Internal Medicine

## 2023-06-03 ENCOUNTER — Telehealth: Payer: Self-pay | Admitting: *Deleted

## 2023-06-03 VITALS — BP 108/72 | HR 83 | Temp 97.7°F | Resp 12 | Ht 65.5 in | Wt 171.1 lb

## 2023-06-03 DIAGNOSIS — S52501P Unspecified fracture of the lower end of right radius, subsequent encounter for closed fracture with malunion: Secondary | ICD-10-CM | POA: Diagnosis not present

## 2023-06-03 DIAGNOSIS — S0990XD Unspecified injury of head, subsequent encounter: Secondary | ICD-10-CM

## 2023-06-03 DIAGNOSIS — H5500 Unspecified nystagmus: Secondary | ICD-10-CM
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
## 2023-06-04 ENCOUNTER — Other Ambulatory Visit: Payer: Self-pay

## 2023-06-04 ENCOUNTER — Emergency Department (HOSPITAL_BASED_OUTPATIENT_CLINIC_OR_DEPARTMENT_OTHER)
Admission: EM | Admit: 2023-06-04 | Discharge: 2023-06-04 | Disposition: A | Discharge status: Home / Self Care | Payer: PRIVATE HEALTH INSURANCE | Attending: Emergency Medicine | Admitting: Emergency Medicine

## 2023-06-04 ENCOUNTER — Emergency Department (HOSPITAL_BASED_OUTPATIENT_CLINIC_OR_DEPARTMENT_OTHER): Payer: PRIVATE HEALTH INSURANCE

## 2023-06-04 ENCOUNTER — Encounter: Payer: Self-pay | Admitting: Internal Medicine

## 2023-06-04 ENCOUNTER — Encounter (HOSPITAL_BASED_OUTPATIENT_CLINIC_OR_DEPARTMENT_OTHER): Payer: Self-pay

## 2023-06-04 DIAGNOSIS — H538 Other visual disturbances: Secondary | ICD-10-CM | POA: Insufficient documentation

## 2023-06-04 LAB — PREGNANCY, URINE: Preg Test, Ur: NEGATIVE
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
## 2023-06-04 NOTE — Discharge Instructions (Signed)
 Seen today for binocular blurry vision.  Aspect is most likely from postconcussive syndrome as her CT of the head was normal.  However you may require further MRI or referral to TBI clinic if symptoms persist.  Return to ED between having new or worsening symptoms including unilateral weakness, worsening vision, increased headache, nausea, vomiting.

## 2023-06-04 NOTE — ED Triage Notes (Addendum)
 Reports blurred vision since wrist surgery on 3/20   VAN negative Denies headache, weakness, dizziness or tingling

## 2023-06-04 NOTE — Patient Instructions (Signed)
 I would like patient to go to [facility] for evaluation in ED. Nystagmus with hx of head trauma is concerning. May just have benign positional vertigo but would like to get her evaluated soon.

## 2023-06-11 ENCOUNTER — Telehealth: Payer: Self-pay | Admitting: Internal Medicine

## 2023-06-11 NOTE — Telephone Encounter (Signed)
 Called and spoke with Margaret Baker about her blurred vision and nausea, it is a little better. She is now able to look at her computer for a little while without getting nauseous. She wanted to hold off on referral, she will call back if she feels she needs a referral to neurology.

## 2023-06-17 ENCOUNTER — Encounter: Payer: Self-pay | Admitting: Internal Medicine

## 2023-06-17 DIAGNOSIS — H5500 Unspecified nystagmus: Secondary | ICD-10-CM

## 2023-06-17 DIAGNOSIS — S0990XD Unspecified injury of head, subsequent encounter: Secondary | ICD-10-CM

## 2024-01-23 LAB — HM MAMMOGRAPHY
